# Patient Record
Sex: Female | Born: 1945 | Race: Black or African American | Hispanic: No | Marital: Single | Smoking: Never smoker
Health system: Southern US, Community
[De-identification: ages and names within clinical notes are randomized; demographics above are authoritative.]

## PROBLEM LIST (undated history)

## (undated) DIAGNOSIS — M4316 Spondylolisthesis, lumbar region: Secondary | ICD-10-CM

## (undated) DIAGNOSIS — D649 Anemia, unspecified: Secondary | ICD-10-CM

## (undated) DIAGNOSIS — E119 Type 2 diabetes mellitus without complications: Secondary | ICD-10-CM

## (undated) DIAGNOSIS — R51 Headache: Secondary | ICD-10-CM

## (undated) DIAGNOSIS — M1990 Unspecified osteoarthritis, unspecified site: Secondary | ICD-10-CM

## (undated) DIAGNOSIS — R519 Headache, unspecified: Secondary | ICD-10-CM

## (undated) DIAGNOSIS — Z9889 Other specified postprocedural states: Secondary | ICD-10-CM

## (undated) DIAGNOSIS — R112 Nausea with vomiting, unspecified: Secondary | ICD-10-CM

## (undated) DIAGNOSIS — I10 Essential (primary) hypertension: Secondary | ICD-10-CM

## (undated) HISTORY — PX: ABDOMINAL HYSTERECTOMY: SHX81

## (undated) HISTORY — PX: DILATION AND CURETTAGE OF UTERUS: SHX78

## (undated) HISTORY — DX: Spondylolisthesis, lumbar region: M43.16

## (undated) HISTORY — PX: OTHER SURGICAL HISTORY: SHX169

## (undated) HISTORY — PX: COLONOSCOPY: SHX174

---

## 1999-06-28 ENCOUNTER — Encounter: Payer: Self-pay | Admitting: Emergency Medicine

## 1999-06-28 ENCOUNTER — Emergency Department (HOSPITAL_COMMUNITY): Admission: EM | Admit: 1999-06-28 | Discharge: 1999-06-28 | Payer: Self-pay | Admitting: Emergency Medicine

## 1999-07-24 ENCOUNTER — Encounter: Payer: Self-pay | Admitting: *Deleted

## 1999-07-24 ENCOUNTER — Ambulatory Visit (HOSPITAL_COMMUNITY): Admission: RE | Admit: 1999-07-24 | Discharge: 1999-07-24 | Payer: Self-pay | Admitting: *Deleted

## 1999-10-02 ENCOUNTER — Ambulatory Visit (HOSPITAL_COMMUNITY): Admission: RE | Admit: 1999-10-02 | Discharge: 1999-10-02 | Payer: Self-pay | Admitting: Specialist

## 1999-10-02 ENCOUNTER — Encounter: Payer: Self-pay | Admitting: Specialist

## 1999-10-18 ENCOUNTER — Ambulatory Visit (HOSPITAL_COMMUNITY): Admission: RE | Admit: 1999-10-18 | Discharge: 1999-10-18 | Payer: Self-pay | Admitting: Specialist

## 1999-10-18 ENCOUNTER — Encounter: Payer: Self-pay | Admitting: Specialist

## 2002-04-14 ENCOUNTER — Other Ambulatory Visit: Admission: RE | Admit: 2002-04-14 | Discharge: 2002-04-14 | Payer: Self-pay | Admitting: Internal Medicine

## 2002-07-16 ENCOUNTER — Ambulatory Visit (HOSPITAL_COMMUNITY): Admission: RE | Admit: 2002-07-16 | Discharge: 2002-07-16 | Payer: Self-pay | Admitting: Gastroenterology

## 2004-07-30 ENCOUNTER — Encounter: Admission: RE | Admit: 2004-07-30 | Discharge: 2004-07-30 | Payer: Self-pay | Admitting: Internal Medicine

## 2004-08-14 ENCOUNTER — Encounter: Admission: RE | Admit: 2004-08-14 | Discharge: 2004-08-14 | Payer: Self-pay | Admitting: Internal Medicine

## 2014-06-16 ENCOUNTER — Encounter (HOSPITAL_COMMUNITY): Payer: Self-pay | Admitting: Emergency Medicine

## 2014-06-16 ENCOUNTER — Emergency Department (HOSPITAL_COMMUNITY): Payer: Medicare Other

## 2014-06-16 ENCOUNTER — Inpatient Hospital Stay (HOSPITAL_COMMUNITY)
Admission: EM | Admit: 2014-06-16 | Discharge: 2014-06-19 | DRG: 638 | Disposition: A | Discharge status: Home / Self Care | Payer: Medicare Other | Attending: Internal Medicine | Admitting: Internal Medicine

## 2014-06-16 DIAGNOSIS — I10 Essential (primary) hypertension: Secondary | ICD-10-CM

## 2014-06-16 DIAGNOSIS — E871 Hypo-osmolality and hyponatremia: Secondary | ICD-10-CM | POA: Diagnosis present

## 2014-06-16 DIAGNOSIS — N179 Acute kidney failure, unspecified: Secondary | ICD-10-CM | POA: Diagnosis present

## 2014-06-16 DIAGNOSIS — D72829 Elevated white blood cell count, unspecified: Secondary | ICD-10-CM

## 2014-06-16 DIAGNOSIS — D638 Anemia in other chronic diseases classified elsewhere: Secondary | ICD-10-CM

## 2014-06-16 DIAGNOSIS — E1110 Type 2 diabetes mellitus with ketoacidosis without coma: Secondary | ICD-10-CM

## 2014-06-16 DIAGNOSIS — E0810 Diabetes mellitus due to underlying condition with ketoacidosis without coma: Secondary | ICD-10-CM

## 2014-06-16 DIAGNOSIS — E875 Hyperkalemia: Secondary | ICD-10-CM | POA: Diagnosis present

## 2014-06-16 DIAGNOSIS — E1310 Other specified diabetes mellitus with ketoacidosis without coma: Principal | ICD-10-CM | POA: Diagnosis present

## 2014-06-16 DIAGNOSIS — Z794 Long term (current) use of insulin: Secondary | ICD-10-CM

## 2014-06-16 DIAGNOSIS — D649 Anemia, unspecified: Secondary | ICD-10-CM | POA: Diagnosis present

## 2014-06-16 HISTORY — DX: Type 2 diabetes mellitus without complications: E11.9

## 2014-06-16 HISTORY — DX: Essential (primary) hypertension: I10

## 2014-06-16 LAB — COMPREHENSIVE METABOLIC PANEL
ALBUMIN: 3.5 g/dL (ref 3.5–5.2)
ALT: 27 U/L (ref 0–35)
ANION GAP: 26 — AB (ref 5–15)
AST: 18 U/L (ref 0–37)
Alkaline Phosphatase: 165 U/L — ABNORMAL HIGH (ref 39–117)
BILIRUBIN TOTAL: 0.4 mg/dL (ref 0.3–1.2)
BUN: 47 mg/dL — ABNORMAL HIGH (ref 6–23)
CALCIUM: 9.5 mg/dL (ref 8.4–10.5)
CHLORIDE: 89 meq/L — AB (ref 96–112)
CO2: 17 meq/L — AB (ref 19–32)
CREATININE: 1.45 mg/dL — AB (ref 0.50–1.10)
GFR, EST AFRICAN AMERICAN: 41 mL/min — AB (ref >90)
GFR, EST NON AFRICAN AMERICAN: 36 mL/min — AB (ref >90)
Glucose, Bld: 897 mg/dL (ref 70–99)
Potassium: 5.9 mEq/L — ABNORMAL HIGH (ref 3.7–5.3)
SODIUM: 132 meq/L — AB (ref 137–147)
Total Protein: 7 g/dL (ref 6.0–8.3)

## 2014-06-16 LAB — CBC
HCT: 36.7 % (ref 36.0–46.0)
Hemoglobin: 11.5 g/dL — ABNORMAL LOW (ref 12.0–15.0)
MCH: 29.3 pg (ref 26.0–34.0)
MCHC: 31.3 g/dL (ref 30.0–36.0)
MCV: 93.6 fL (ref 78.0–100.0)
Platelets: 268 10*3/uL (ref 150–400)
RBC: 3.92 MIL/uL (ref 3.87–5.11)
RDW: 13.5 % (ref 11.5–15.5)
WBC: 11.2 10*3/uL — AB (ref 4.0–10.5)

## 2014-06-16 LAB — URINE MICROSCOPIC-ADD ON

## 2014-06-16 LAB — URINALYSIS, ROUTINE W REFLEX MICROSCOPIC
BILIRUBIN URINE: NEGATIVE
Glucose, UA: >1000 mg/dL — AB
Hgb urine dipstick: NEGATIVE
KETONES UR: 15 mg/dL — AB
LEUKOCYTES UA: NEGATIVE
Nitrite: NEGATIVE
PH: 5 (ref 5.0–8.0)
PROTEIN: NEGATIVE mg/dL
Specific Gravity, Urine: 1.03 (ref 1.005–1.030)
UROBILINOGEN UA: 0.2 mg/dL (ref 0.0–1.0)

## 2014-06-16 LAB — CBG MONITORING, ED: Glucose-Capillary: >600 mg/dL (ref 70–99)

## 2014-06-16 LAB — I-STAT TROPONIN, ED: Troponin i, poc: 0.01 ng/mL (ref 0.00–0.08)

## 2014-06-16 LAB — I-STAT CG4 LACTIC ACID, ED: Lactic Acid, Venous: 3.06 mmol/L — ABNORMAL HIGH (ref 0.5–2.2)

## 2014-06-16 LAB — MRSA PCR SCREENING: MRSA BY PCR: NEGATIVE

## 2014-06-16 MED ORDER — ONDANSETRON HCL 4 MG PO TABS: 4.0000 mg | ORAL_TABLET | Freq: Four times a day (QID) | ORAL | Status: DC | PRN

## 2014-06-16 MED ORDER — DEXTROSE-NACL 5-0.45 % IV SOLN
INTRAVENOUS | Status: DC
  Administered 2014-06-17: 03:00:00 via INTRAVENOUS

## 2014-06-16 MED ORDER — HEPARIN SODIUM (PORCINE) 5000 UNIT/ML IJ SOLN
5000.0000 [IU] | Freq: Three times a day (TID) | INTRAMUSCULAR | Status: DC
  Administered 2014-06-16 – 2014-06-19 (×8): 5000 [IU] via SUBCUTANEOUS
  Filled 2014-06-16 (×8): qty 1

## 2014-06-16 MED ORDER — DOCUSATE SODIUM 100 MG PO CAPS
100.0000 mg | ORAL_CAPSULE | Freq: Two times a day (BID) | ORAL | Status: DC
  Administered 2014-06-16 – 2014-06-18 (×5): 100 mg via ORAL
  Filled 2014-06-16 (×5): qty 1

## 2014-06-16 MED ORDER — HYDRALAZINE HCL 10 MG PO TABS
10.0000 mg | ORAL_TABLET | Freq: Four times a day (QID) | ORAL | Status: DC | PRN
  Administered 2014-06-19: 10 mg via ORAL
  Filled 2014-06-16 (×2): qty 1

## 2014-06-16 MED ORDER — INSULIN REGULAR HUMAN 100 UNIT/ML IJ SOLN
INTRAMUSCULAR | Status: DC
  Administered 2014-06-16: 10.8 [IU]/h via INTRAVENOUS
  Administered 2014-06-16: 5.4 [IU]/h via INTRAVENOUS
  Filled 2014-06-16: qty 1

## 2014-06-16 MED ORDER — SODIUM CHLORIDE 0.9 % IV SOLN: INTRAVENOUS | Status: DC

## 2014-06-16 MED ORDER — ADULT MULTIVITAMIN W/MINERALS CH
1.0000 | ORAL_TABLET | Freq: Every day | ORAL | Status: DC
  Administered 2014-06-17 – 2014-06-19 (×3): 1 via ORAL
  Filled 2014-06-16 (×4): qty 1

## 2014-06-16 MED ORDER — FOLIC ACID 1 MG PO TABS
1.0000 mg | ORAL_TABLET | Freq: Every day | ORAL | Status: DC
  Administered 2014-06-17 – 2014-06-19 (×3): 1 mg via ORAL
  Filled 2014-06-16 (×4): qty 1

## 2014-06-16 MED ORDER — DEXTROSE 50 % IV SOLN: 25.0000 mL | INTRAVENOUS | Status: DC | PRN

## 2014-06-16 MED ORDER — DEXTROSE-NACL 5-0.45 % IV SOLN: INTRAVENOUS | Status: DC

## 2014-06-16 MED ORDER — SODIUM CHLORIDE 0.9 % IV SOLN
1000.0000 mL | Freq: Once | INTRAVENOUS | Status: AC
  Administered 2014-06-16: 1000 mL via INTRAVENOUS

## 2014-06-16 MED ORDER — ONDANSETRON HCL 4 MG/2ML IJ SOLN: 4.0000 mg | Freq: Four times a day (QID) | INTRAMUSCULAR | Status: DC | PRN

## 2014-06-16 MED ORDER — SODIUM CHLORIDE 0.9 % IV SOLN
1000.0000 mL | INTRAVENOUS | Status: DC
Start: 2014-06-16 — End: 2014-06-17
  Administered 2014-06-16: 1000 mL via INTRAVENOUS

## 2014-06-16 MED ORDER — VITAMIN B-1 100 MG PO TABS
100.0000 mg | ORAL_TABLET | Freq: Every day | ORAL | Status: DC
  Administered 2014-06-17 – 2014-06-19 (×3): 100 mg via ORAL
  Filled 2014-06-16 (×4): qty 1

## 2014-06-16 MED ORDER — BIOTENE DRY MOUTH MT LIQD
15.0000 mL | Freq: Two times a day (BID) | OROMUCOSAL | Status: DC
Start: 2014-06-16 — End: 2014-06-19
  Administered 2014-06-16 – 2014-06-19 (×5): 15 mL via OROMUCOSAL

## 2014-06-16 MED ORDER — LORATADINE 10 MG PO TABS
10.0000 mg | ORAL_TABLET | Freq: Every day | ORAL | Status: DC | PRN
  Administered 2014-06-17 – 2014-06-18 (×4): 10 mg via ORAL
  Filled 2014-06-16 (×4): qty 1

## 2014-06-16 MED ORDER — SODIUM CHLORIDE 0.9 % IJ SOLN
3.0000 mL | Freq: Two times a day (BID) | INTRAMUSCULAR | Status: DC
  Administered 2014-06-17 – 2014-06-19 (×5): 3 mL via INTRAVENOUS

## 2014-06-16 MED ORDER — INSULIN REGULAR HUMAN 100 UNIT/ML IJ SOLN
INTRAMUSCULAR | Status: DC
  Administered 2014-06-17: 2.4 [IU]/h via INTRAVENOUS
  Filled 2014-06-16 (×2): qty 1

## 2014-06-16 MED ORDER — INSULIN REGULAR BOLUS VIA INFUSION
0.0000 [IU] | Freq: Three times a day (TID) | INTRAVENOUS | Status: DC
  Filled 2014-06-16: qty 10

## 2014-06-16 MED ORDER — ASPIRIN EC 81 MG PO TBEC
81.0000 mg | DELAYED_RELEASE_TABLET | Freq: Every day | ORAL | Status: DC
Start: 2014-06-17 — End: 2014-06-19
  Administered 2014-06-17 – 2014-06-19 (×3): 81 mg via ORAL
  Filled 2014-06-16 (×4): qty 1

## 2014-06-16 NOTE — H&P (Signed)
[facility] History and Physical  Cheryl Carey ZOX:[id number] DOB: 04-30-1946 DOA: 06/16/2014  Referring physician: Marena ChancyWilliam Walden, MD PCP: No primary provider on file.   Chief Complaint: DKA  HPI: Cheryl Carey is a 68 y.o. female with prior history of diabetes on insulin presents with severe hyperglycemia. Patient states that she had recently been in [place]. She states that she had noted she was having some sinus congestion but did not really think too much about it. Patient states that she has been a little more tired and she has noted some lightheadedness. She did not lose consciousness. She states that now she seems to be feeling better. On evaluation she was not making eye contact. She states that she has had no urine difficulty and she has no respiratory symptoms.   Review of Systems:  Constitutional:  No weight loss, night sweats, Fevers, chills, ++fatigue.  HEENT:  ++headaches,no itching, ear ache, nasal congestion, post nasal drip,  Cardio-vascular:  No chest pain, Orthopnea, PND, swelling in lower extremities  GI:  No heartburn, indigestion, abdominal pain, nausea, vomiting, diarrhea  Resp:  No shortness of breath with exertion or at rest. No excess mucus, no productive cough, No non-productive cough Skin:  no rash or lesions.  GU:  no dysuria, change in color of urine, no urgency or frequency Musculoskeletal:  No joint pain or swelling. No decreased range of motion  Psych:  No change in mood or affect. No depression or anxiety.   Past Medical History  Diagnosis Date  . Hypertension   . Diabetes mellitus without complication    Past Surgical History  Procedure Laterality Date  . Abdominal hysterectomy     Social History:  reports that she has never smoked. She does not have any smokeless tobacco history on file. She reports that she does not drink alcohol or use illicit drugs.  Allergies  Allergen Reactions  . Codeine Nausea Only    No family  history on file.   Prior to Admission medications   Medication Sig Start Date End Date Taking? Authorizing Provider  insulin detemir (LEVEMIR) 100 UNIT/ML injection Inject 3-19 Units into the skin daily. 19 units in the morning and 3 in the evening   Yes Historical Provider, MD  insulin lispro (HUMALOG) 100 UNIT/ML injection Inject 3-6 Units into the skin 3 (three) times daily before meals.   Yes Historical Provider, MD   Physical Exam: Filed Vitals:   06/16/14 1920  BP: 161/61  Pulse: 93  Temp:   Resp: 21    BP 161/61  Pulse 93  Temp(Src) 98.9 F (37.2 C) (Oral)  Resp 21  Ht 5\' 2"  (1.575 m)  SpO2 100%  General:  Appears calm and comfortable Eyes: PERRL, normal lids, irises & conjunctiva ENT: grossly normal hearing, lips & tongue Neck: no LAD, masses or thyromegaly Cardiovascular: RRR, no m/r/g. No LE edema. Respiratory: CTA bilaterally, no w/r/r. Normal respiratory effort. Abdomen: soft, ntnd Skin: no rash or induration seen on limited exam Musculoskeletal: grossly normal tone BUE/BLE Psychiatric: somewhat confused, speech fluent Neurologic: grossly non-focal.          Labs on Admission:  Basic Metabolic Panel:  Recent Labs Lab 06/16/14 1850  NA 132*  K 5.9*  CL 89*  CO2 17*  GLUCOSE 897*  BUN 47*  CREATININE 1.45*  CALCIUM 9.5   Liver Function Tests:  Recent Labs Lab 06/16/14 1850  AST 18  ALT 27  ALKPHOS 165*  BILITOT 0.4  PROT 7.0  ALBUMIN 3.5   No results found for this basename: LIPASE, AMYLASE,  in the last 168 hours No results found for this basename: AMMONIA,  in the last 168 hours CBC:  Recent Labs Lab 06/16/14 1850  WBC 11.2*  HGB 11.5*  HCT 36.7  MCV 93.6  PLT 268   Cardiac Enzymes: No results found for this basename: CKTOTAL, CKMB, CKMBINDEX, TROPONINI,  in the last 168 hours  BNP (last 3 results) No results found for this basename: PROBNP,  in the last 8760 hours CBG:  Recent Labs Lab 06/16/14 1825 06/16/14 2019    GLUCAP >600* >600*    Radiological Exams on Admission: Dg Chest 2 View  06/16/2014   CLINICAL DATA:  Hyperglycemia.  Weakness.  EXAM: CHEST  2 VIEW  COMPARISON:  None.  FINDINGS: The heart size and mediastinal contours are within normal limits. Both lungs are clear. There is a surgical screw which crosses the right scapula and the adjacent to the glenoid. Bony thorax is otherwise unremarkable.  IMPRESSION: No active cardiopulmonary disease.   Electronically Signed   By: Amie Portland M.D.   On: 06/16/2014 20:07     Assessment/Plan Principal Problem:   DKA (diabetic ketoacidoses) Active Problems:   Hypertension   1. DKA -patient will be admitted to stepdown -will start on insulin drip -hydrate with IV NS x2 liter bolus than run until glucose is below 250 -will check A1C  2. Hypertension -will continue with home medications  3. Confusion -not clear she is a little confused but not sureif this is baseline or related to her high glucose -would scan her head presently -in addition she did initially complain of having sinus congestion and pain would start on levaquin emperrically  4. Electrolyte disturbances -related to her hyperglycemia -will hydrate and start on insulin    Code Status: Full Code (must indicate code status--if unknown or must be presumed, indicate so) Family Communication: None (indicate person spoken with, if applicable, with phone number if by telephone) Disposition Plan: Home (indicate anticipated LOS)  Time spent:  [facility] Pager [phone]  **Disclaimer: This note may have been dictated with voice recognition software. Similar sounding words can inadvertently be transcribed and this note may contain transcription errors which may not have been corrected upon publication of note.**

## 2014-06-16 NOTE — ED Notes (Signed)
Patient unable to void at this time

## 2014-06-16 NOTE — ED Provider Notes (Signed)
CSN: [id number]     Arrival date & time 06/16/14  1813 History   First MD Initiated Contact with Patient 06/16/14 1822     Chief Complaint  Patient presents with  . Hyperglycemia     (Consider location/radiation/quality/duration/timing/severity/associated sxs/prior Treatment) Patient is a 68 y.o. female presenting with dizziness. The history is provided by the patient.  Dizziness Quality:  Lightheadedness Severity:  Moderate Onset quality:  Sudden Timing:  Intermittent Progression:  Resolved Chronicity:  New Context comment:  Spontaneously Relieved by:  Nothing Worsened by:  Nothing tried Associated symptoms: no shortness of breath and no vomiting     Past Medical History  Diagnosis Date  . Hypertension   . Diabetes mellitus without complication    Past Surgical History  Procedure Laterality Date  . Abdominal hysterectomy     No family history on file. History  Substance Use Topics  . Smoking status: Never Smoker   . Smokeless tobacco: Not on file  . Alcohol Use: No   OB History   Grav Para Term Preterm Abortions TAB SAB Ect Mult Living                 Review of Systems  Constitutional: Negative for fever.  Respiratory: Negative for cough and shortness of breath.   Gastrointestinal: Negative for vomiting and abdominal pain.  Neurological: Positive for dizziness.  All other systems reviewed and are negative.     Allergies  Review of patient's allergies indicates not on file.  Home Medications   Prior to Admission medications   Not on File   BP 161/61  Pulse 93  Temp(Src) 98.9 F (37.2 C) (Oral)  Resp 21  SpO2 100% Physical Exam  Nursing note and vitals reviewed. Constitutional: She is oriented to person, place, and time. She appears well-developed and well-nourished. No distress.  HENT:  Head: Normocephalic and atraumatic.  Mouth/Throat: Oropharynx is clear and moist.  Eyes: EOM are normal. Pupils are equal, round, and reactive to light.   Neck: Normal range of motion. Neck supple.  Cardiovascular: Normal rate and regular rhythm.  Exam reveals no friction rub.   No murmur heard. Pulmonary/Chest: Effort normal and breath sounds normal. No respiratory distress. She has no wheezes. She has no rales.  Abdominal: Soft. She exhibits no distension. There is no tenderness. There is no rebound.  Musculoskeletal: Normal range of motion. She exhibits no edema.  Neurological: She is alert and oriented to person, place, and time.  Skin: No rash noted. She is not diaphoretic.    ED Course  Procedures (including critical care time) Labs Review Labs Reviewed  CBC - Abnormal; Notable for the following:    WBC 11.2 (*)    Hemoglobin 11.5 (*)    All other components within normal limits  CBG MONITORING, ED - Abnormal; Notable for the following:    Glucose-Capillary >600 (*)    All other components within normal limits  COMPREHENSIVE METABOLIC PANEL  URINALYSIS, ROUTINE W REFLEX MICROSCOPIC  I-STAT TROPOININ, ED  I-STAT CG4 LACTIC ACID, ED    Imaging Review No results found.   EKG Interpretation None     CRITICAL CARE Performed by: Dagmar Hait   Total critical care time: 30 minutes   Critical care time was exclusive of separately billable procedures and treating other patients.  Critical care was necessary to treat or prevent imminent or life-threatening deterioration.  Critical care was time spent personally by me on the following activities: development of treatment plan with patient  and/or surrogate as well as nursing, discussions with consultants, evaluation of patient's response to treatment, examination of patient, obtaining history from patient or surrogate, ordering and performing treatments and interventions, ordering and review of laboratory studies, ordering and review of radiographic studies, pulse oximetry and re-evaluation of patient's condition.  MDM   Final diagnoses:  Diabetic ketoacidosis  without coma associated with type 2 diabetes mellitus  Essential hypertension    63F here with hyperglycemia. Recent flare up of allergies while traveling to [place], returned from there 2 days ago. States she's feeling better today but not 100%. Pulled over while driving because she was feeling lightheaded and dizzy - no CP, no SOB. Mild associated nausea. Took her insulin this morning as instructed, only missed midday short-acting. Here AFVSS. Slowly responding, but acting answering appropriately.  Labs show anion gap of 26, glucose of 890s. EKG and troponin ok, no PNA or UTI. Glucosestabilizer initiated, admitted.     Dagmar HaitWilliam Arron Mcnaught, MD 06/17/14 [phone]

## 2014-06-16 NOTE — ED Notes (Signed)
Bed: WA08 Expected date:  Expected time:  Means of arrival:  Comments: EMS- leg pain

## 2014-06-16 NOTE — ED Notes (Signed)
Per EMS pt hyperglycemic. Pt pulled on side of road because she started feeling bad. Pt thought she had low sugar so started eating. EMS read above 500 with CBG. Pt able to follow commands and answer questions but slow to respond questions.

## 2014-06-16 NOTE — Progress Notes (Signed)
  CARE MANAGEMENT ED NOTE 06/16/2014  Patient:  Cheryl Carey,Cheryl Carey   Account Number:  [id number]  Date Initiated:  06/16/2014  Documentation initiated by:  Radford PaxFERRERO,Artisha Capri  Subjective/Objective Assessment:   Patient presents to Ed with severe hyperglycemia.     Subjective/Objective Assessment Detail:   Blood sugar >600     Action/Plan:   Action/Plan Detail:   Anticipated DC Date:       Status Recommendation to Physician:   Result of Recommendation:    Other ED Services  Consult Working Plan    DC Planning Services  Other  PCP issues    Choice offered to / List presented to:            Status of service:  Completed, signed off  ED Comments:   ED Comments Detail:  EDCM spoke to patient at bedside.  Patient cannot remember the name of her pcp, but her Endocronologist is Dr. Horald PollenBalen.

## 2014-06-17 ENCOUNTER — Encounter (HOSPITAL_COMMUNITY): Payer: Self-pay

## 2014-06-17 DIAGNOSIS — D638 Anemia in other chronic diseases classified elsewhere: Secondary | ICD-10-CM

## 2014-06-17 DIAGNOSIS — E1310 Other specified diabetes mellitus with ketoacidosis without coma: Principal | ICD-10-CM

## 2014-06-17 DIAGNOSIS — D72829 Elevated white blood cell count, unspecified: Secondary | ICD-10-CM

## 2014-06-17 DIAGNOSIS — I10 Essential (primary) hypertension: Secondary | ICD-10-CM

## 2014-06-17 LAB — TSH: TSH: 0.424 u[IU]/mL (ref 0.350–4.500)

## 2014-06-17 LAB — GLUCOSE, CAPILLARY
GLUCOSE-CAPILLARY: 566 mg/dL — AB (ref 70–99)
GLUCOSE-CAPILLARY: 206 mg/dL — AB (ref 70–99)
GLUCOSE-CAPILLARY: 106 mg/dL — AB (ref 70–99)
GLUCOSE-CAPILLARY: 84 mg/dL (ref 70–99)
GLUCOSE-CAPILLARY: 244 mg/dL — AB (ref 70–99)
GLUCOSE-CAPILLARY: 346 mg/dL — AB (ref 70–99)
Glucose-Capillary: 120 mg/dL — ABNORMAL HIGH (ref 70–99)
Glucose-Capillary: 158 mg/dL — ABNORMAL HIGH (ref 70–99)
Glucose-Capillary: 201 mg/dL — ABNORMAL HIGH (ref 70–99)
Glucose-Capillary: 292 mg/dL — ABNORMAL HIGH (ref 70–99)
Glucose-Capillary: 292 mg/dL — ABNORMAL HIGH (ref 70–99)
Glucose-Capillary: 370 mg/dL — ABNORMAL HIGH (ref 70–99)
Glucose-Capillary: 492 mg/dL — ABNORMAL HIGH (ref 70–99)
Glucose-Capillary: 89 mg/dL (ref 70–99)
Glucose-Capillary: 99 mg/dL (ref 70–99)

## 2014-06-17 LAB — COMPREHENSIVE METABOLIC PANEL
ALK PHOS: 108 U/L (ref 39–117)
ALT: 25 U/L (ref 0–35)
ANION GAP: 13 (ref 5–15)
AST: 17 U/L (ref 0–37)
Albumin: 3.1 g/dL — ABNORMAL LOW (ref 3.5–5.2)
BILIRUBIN TOTAL: 0.3 mg/dL (ref 0.3–1.2)
BUN: 39 mg/dL — ABNORMAL HIGH (ref 6–23)
CHLORIDE: 102 meq/L (ref 96–112)
CO2: 24 mEq/L (ref 19–32)
Calcium: 9 mg/dL (ref 8.4–10.5)
Creatinine, Ser: 1.3 mg/dL — ABNORMAL HIGH (ref 0.50–1.10)
GFR calc non Af Amer: 41 mL/min — ABNORMAL LOW (ref >90)
GFR, EST AFRICAN AMERICAN: 47 mL/min — AB (ref >90)
GLUCOSE: 213 mg/dL — AB (ref 70–99)
POTASSIUM: 4.1 meq/L (ref 3.7–5.3)
Sodium: 139 mEq/L (ref 137–147)
Total Protein: 6.3 g/dL (ref 6.0–8.3)

## 2014-06-17 LAB — CBC
HCT: 31.8 % — ABNORMAL LOW (ref 36.0–46.0)
Hemoglobin: 10.6 g/dL — ABNORMAL LOW (ref 12.0–15.0)
MCH: 29.6 pg (ref 26.0–34.0)
MCHC: 33.3 g/dL (ref 30.0–36.0)
MCV: 88.8 fL (ref 78.0–100.0)
Platelets: 255 10*3/uL (ref 150–400)
RBC: 3.58 MIL/uL — ABNORMAL LOW (ref 3.87–5.11)
RDW: 13 % (ref 11.5–15.5)
WBC: 12.5 10*3/uL — ABNORMAL HIGH (ref 4.0–10.5)

## 2014-06-17 LAB — HEMOGLOBIN A1C
Hgb A1c MFr Bld: 9.7 % — ABNORMAL HIGH (ref <5.7)
Mean Plasma Glucose: 232 mg/dL — ABNORMAL HIGH (ref <117)

## 2014-06-17 MED ORDER — INSULIN GLARGINE 100 UNIT/ML ~~LOC~~ SOLN
5.0000 [IU] | Freq: Once | SUBCUTANEOUS | Status: AC
  Administered 2014-06-17: 5 [IU] via SUBCUTANEOUS
  Filled 2014-06-17: qty 0.05

## 2014-06-17 MED ORDER — ALUM & MAG HYDROXIDE-SIMETH 200-200-20 MG/5ML PO SUSP
ORAL | Status: AC
  Filled 2014-06-17: qty 30

## 2014-06-17 MED ORDER — INSULIN ASPART 100 UNIT/ML ~~LOC~~ SOLN
0.0000 [IU] | Freq: Three times a day (TID) | SUBCUTANEOUS | Status: DC
  Administered 2014-06-17: 5 [IU] via SUBCUTANEOUS
  Administered 2014-06-17: 11 [IU] via SUBCUTANEOUS
  Administered 2014-06-18: 15 [IU] via SUBCUTANEOUS
  Administered 2014-06-18: 5 [IU] via SUBCUTANEOUS
  Administered 2014-06-19: 2 [IU] via SUBCUTANEOUS

## 2014-06-17 MED ORDER — ALUM & MAG HYDROXIDE-SIMETH 200-200-20 MG/5ML PO SUSP
30.0000 mL | Freq: Once | ORAL | Status: AC
  Administered 2014-06-17: 30 mL via ORAL

## 2014-06-17 MED ORDER — INSULIN ASPART 100 UNIT/ML ~~LOC~~ SOLN
0.0000 [IU] | Freq: Every day | SUBCUTANEOUS | Status: DC
  Administered 2014-06-17: 3 [IU] via SUBCUTANEOUS

## 2014-06-17 MED ORDER — AMLODIPINE BESYLATE 5 MG PO TABS
2.5000 mg | ORAL_TABLET | Freq: Every day | ORAL | Status: DC
  Administered 2014-06-17 – 2014-06-18 (×2): 2.5 mg via ORAL
  Filled 2014-06-17 (×2): qty 1

## 2014-06-17 NOTE — Progress Notes (Signed)
Patient ID: Cheryl Carey, female   DOB: August 14, 1946, 68 y.o.   MRN: [id number] [facility] PROGRESS NOTE  Cheryl Carey [id number]:[id number] DOB: 05/23/1946 DOA: 06/16/2014 PCP: No primary provider on file.  Brief narrative: 68 y.o. female with prior history of diabetes on insulin who presented to [facility] ED 06/16/2014 lightheadedness, near-syncope. Patient reports being compliant with insulin regimen and never experienced similar type of event. On admission, blood glucose was 897, she was found to be in DKA. She required admission to step down unit and insulin drip.  Assessment/Plan:  Principal Problem:   DKA (diabetic ketoacidoses)  Required insulin drip on admission. She's still on insulin drip this morning. Her anion gap is closed and CO2 is within normal limits. We will transition to subcutaneous insulin. She will be given Lantus 5 units subcutaneously, we will then stop insulin drip in about one to 2 hours. Patient can then have diabetic diet.  Glycemic order set in place.  Check A1c  Diabetic coordinator consulted, appreciate their recommendations Active Problems:   Hypertension  Her blood pressure was low on admission 98/78. This morning blood pressure is 140/80. We will start Norvasc 2.5 mg daily for better blood pressure control.   Acute renal failure  No previous blood work for comparison. Her creatinine was 1.45 on the admission and is trending down with IV fluids to 1.30. This is likely pre-renal etiology, dehydration and DKA seems to be contributing factors.   Anemia, normocytic  She is on folate supplement. She could also have anemia of chronic disease related to renal insufficiency. We don't have previous values for comparison.  Her hemoglobin is stable with no indications for transfusion.   Hyperkalemia  Likely secondary to diabetic ketoacidosis. Has resolved with insulin.   Hyponatremia  Likely due to dehydration. Has resolved with IV fluids.   DVT prophylaxis:  Heparin subcutaneous while patient is in hospital  Code Status: full code  Family Communication: plan of care discussed with the patient Disposition Plan: Remains in step down unit, still is on insulin drip  Manson Passey, MD  [facility] Pager [phone]  If 7PM-7AM, please contact night-coverage [email or web address] Password [email or web address] 06/17/2014, 10:17 AM   LOS: 1 day   Consultants:  None   Procedures:  None   Antibiotics:  None   HPI/Subjective: No acute overnight events.  Objective: Filed Vitals:   06/17/14 0500 06/17/14 0600 06/17/14 0736 06/17/14 0800  BP:  145/52 140/80   Pulse: 77 73 73   Temp:    98.4 F (36.9 C)  TempSrc:    Oral  Resp: 18 21 20    Height:      Weight:      SpO2: 95% 100% 98%     Intake/Output Summary (Last 24 hours) at 06/17/14 1017 Last data filed at 06/17/14 0800  Gross per 24 hour  Intake 1016.06 ml  Output   1600 ml  Net -583.94 ml    Exam:   General:  Pt is alert, follows commands appropriately, not in acute distress  Cardiovascular: Regular rate and rhythm, S1/S2, no murmurs  Respiratory: Clear to auscultation bilaterally, no wheezing, no crackles, no rhonchi  Abdomen: Soft, non tender, non distended, bowel sounds present  Extremities: No edema, pulses DP and PT palpable bilaterally  Neuro: Grossly nonfocal  Data Reviewed: Basic Metabolic Panel:  Recent Labs Lab 06/16/14 1850 06/17/14 0340  NA 132* 139  K 5.9* 4.1  CL 89* 102  CO2 17* 24  GLUCOSE 897* 213*  BUN  47* 39*  CREATININE 1.45* 1.30*  CALCIUM 9.5 9.0   Liver Function Tests:  Recent Labs Lab 06/16/14 1850 06/17/14 0340  AST 18 17  ALT 27 25  ALKPHOS 165* 108  BILITOT 0.4 0.3  PROT 7.0 6.3  ALBUMIN 3.5 3.1*   No results found for this basename: LIPASE, AMYLASE,  in the last 168 hours No results found for this basename: AMMONIA,  in the last 168 hours CBC:  Recent Labs Lab 06/16/14 1850 06/17/14 0340  WBC 11.2* 12.5*  HGB 11.5*  10.6*  HCT 36.7 31.8*  MCV 93.6 88.8  PLT 268 255   Cardiac Enzymes: No results found for this basename: CKTOTAL, CKMB, CKMBINDEX, TROPONINI,  in the last 168 hours BNP: No components found with this basename: POCBNP,  CBG:  Recent Labs Lab 06/17/14 0620 06/17/14 0724 06/17/14 0814 06/17/14 0909 06/17/14 0924  GLUCAP 120* 106* 84 99 89    Recent Results (from the past 240 hour(s))  MRSA PCR SCREENING     Status: None   Collection Time    06/16/14 10:25 PM      Result Value Ref Range Status   MRSA by PCR NEGATIVE  NEGATIVE Final     Studies: Dg Chest 2 View 06/16/2014    IMPRESSION: No active cardiopulmonary disease.   Electronically Signed   By: Amie Portlandavid  Ormond M.D.   On: 06/16/2014 20:07    Scheduled Meds: . aspirin EC  81 mg Oral Daily  . docusate sodium  100 mg Oral BID  . folic acid  1 mg Oral Daily  . heparin  5,000 Units Subcutaneous 3 times per day  . insulin aspart  0-15 Units Subcutaneous TID WC  . insulin aspart  0-5 Units Subcutaneous QHS  . insulin regular  0-10 Units Intravenous TID WC  . multivitamin   1 tablet Oral Daily  . thiamine  100 mg Oral Daily

## 2014-06-17 NOTE — Progress Notes (Signed)
Inpatient Diabetes Program Recommendations  AACE/ADA: New Consensus Statement on Inpatient Glycemic Control (2013)  Target Ranges:  Prepandial:   less than 140 mg/dL      Peak postprandial:   less than 180 mg/dL (1-2 hours)      Critically ill patients:  140 - 180 mg/dL   Inpatient Diabetes Program Recommendations Insulin - Basal: change to Levemir 15 units  HgbA1C: =9.7  Note: This coordinator spoke with patient concerning admit with DKA.  Pt reports that she takes her insulin as prescribed but admits to dietary indiscretion during vacation recently.  She reports that she did take her insulin while on vacation.  Pt became disoriented and lightheaded while driving so she pulled off road and had a snack.  She assumed that her glucose was low but she did not have a meter with her.  She does not remember events of the day leading up to this event.  She does not remember if she checked her CBG or took her insulin.  She does not even remember where she was going or where she was found.  She assumes that she did check her glucose that day and took her insulin because it is routine for her.  She has an appointment next week with her endocrinologist Dr. Talmage NapBalan.  She now has a plan to check her CBG just prior to driving a car and also to take a glucose meter with her at all times.  No further questions/concerns at the end of our conversation. Thank you  Piedad ClimesGina Aleasha Fregeau BSN, RN,CDE Inpatient Diabetes Coordinator [phone] (team pager)

## 2014-06-18 LAB — GLUCOSE, CAPILLARY
GLUCOSE-CAPILLARY: 397 mg/dL — AB (ref 70–99)
GLUCOSE-CAPILLARY: 413 mg/dL — AB (ref 70–99)
Glucose-Capillary: 240 mg/dL — ABNORMAL HIGH (ref 70–99)
Glucose-Capillary: 372 mg/dL — ABNORMAL HIGH (ref 70–99)
Glucose-Capillary: 449 mg/dL — ABNORMAL HIGH (ref 70–99)
Glucose-Capillary: 117 mg/dL — ABNORMAL HIGH (ref 70–99)

## 2014-06-18 LAB — BASIC METABOLIC PANEL
Anion gap: 11 (ref 5–15)
BUN: 33 mg/dL — ABNORMAL HIGH (ref 6–23)
CALCIUM: 9.3 mg/dL (ref 8.4–10.5)
CO2: 25 mEq/L (ref 19–32)
Chloride: 99 mEq/L (ref 96–112)
Creatinine, Ser: 1.3 mg/dL — ABNORMAL HIGH (ref 0.50–1.10)
GFR calc non Af Amer: 41 mL/min — ABNORMAL LOW (ref >90)
GFR, EST AFRICAN AMERICAN: 47 mL/min — AB (ref >90)
Glucose, Bld: 423 mg/dL — ABNORMAL HIGH (ref 70–99)
POTASSIUM: 4.9 meq/L (ref 3.7–5.3)
SODIUM: 135 meq/L — AB (ref 137–147)

## 2014-06-18 MED ORDER — INSULIN DETEMIR 100 UNIT/ML ~~LOC~~ SOLN
21.0000 [IU] | Freq: Every day | SUBCUTANEOUS | Status: DC
  Administered 2014-06-18: 21 [IU] via SUBCUTANEOUS
  Filled 2014-06-18 (×2): qty 0.21

## 2014-06-18 MED ORDER — INSULIN ASPART 100 UNIT/ML ~~LOC~~ SOLN: 0.0000 [IU] | Freq: Three times a day (TID) | SUBCUTANEOUS | Status: DC

## 2014-06-18 MED ORDER — INSULIN ASPART 100 UNIT/ML ~~LOC~~ SOLN: 15.0000 [IU] | Freq: Once | SUBCUTANEOUS | Status: DC

## 2014-06-18 MED ORDER — ACETAMINOPHEN 325 MG PO TABS
650.0000 mg | ORAL_TABLET | Freq: Four times a day (QID) | ORAL | Status: DC | PRN
  Administered 2014-06-18: 650 mg via ORAL
  Filled 2014-06-18: qty 2

## 2014-06-18 MED ORDER — INSULIN ASPART 100 UNIT/ML ~~LOC~~ SOLN
6.0000 [IU] | Freq: Once | SUBCUTANEOUS | Status: AC
  Administered 2014-06-18: 6 [IU] via SUBCUTANEOUS

## 2014-06-18 MED ORDER — INSULIN ASPART 100 UNIT/ML ~~LOC~~ SOLN
6.0000 [IU] | Freq: Three times a day (TID) | SUBCUTANEOUS | Status: DC
Start: 2014-06-18 — End: 2014-06-19
  Administered 2014-06-18 – 2014-06-19 (×2): 6 [IU] via SUBCUTANEOUS

## 2014-06-18 MED ORDER — AMLODIPINE BESYLATE 2.5 MG PO TABS: 2.5000 mg | ORAL_TABLET | Freq: Every day | ORAL | Status: DC

## 2014-06-18 MED ORDER — INSULIN ASPART 100 UNIT/ML ~~LOC~~ SOLN: 10.0000 [IU] | Freq: Once | SUBCUTANEOUS | Status: DC

## 2014-06-18 MED ORDER — METFORMIN HCL 500 MG PO TABS
1000.0000 mg | ORAL_TABLET | Freq: Two times a day (BID) | ORAL | Status: DC
  Administered 2014-06-18 – 2014-06-19 (×3): 1000 mg via ORAL
  Filled 2014-06-18 (×5): qty 2

## 2014-06-18 NOTE — Progress Notes (Signed)
Patient ID: Cheryl Carey, female   DOB: August 11, 1946, 68 y.o.   MRN: [id number] [facility] PROGRESS NOTE  KAILANA BENNINGER [id number]:[id number] DOB: June 23, 1946 DOA: 06/16/2014 PCP: No primary provider on file.  Brief narrative: 68 y.o. female with prior history of diabetes on insulin who presented to [facility] ED 06/16/2014 lightheadedness, near-syncope. Patient reports being compliant with insulin regimen and never experienced similar type of event. On admission, blood glucose was 897, she was found to be in DKA. She required admission to step down unit and insulin drip.   Assessment/Plan:   Principal Problem:  DKA (diabetic ketoacidoses)  Required insulin drip on admission. AG closed but CBG's still in 300-400 range; will get BMP today and she may require re-starting insulin drip Restarted insulin home regimen; levemir 21 units daily and novolog 6 units TIDAC Continue sliding scale insulin  Diabetic coordinator consulted, appreciate their recommendations Active Problems:  Hypertension  Added Norvasc 2.5 mg daily for better blood pressure control. Acute renal failure  No previous blood work for comparison. Her creatinine was 1.45 on the admission and is trending down with IV fluids to 1.30. This is likely pre-renal etiology, dehydration and DKA all seems to be contributing factors. Anemia, normocytic  She is on folate supplement. She could also have anemia of chronic disease related to renal insufficiency. We don't have previous values for comparison.  Her hemoglobin is stable with no indications for transfusion. Hyperkalemia  Likely secondary to diabetic ketoacidosis. Has resolved with insulin. Hyponatremia  Likely due to dehydration. Has resolved with IV fluids.   DVT prophylaxis: Heparin subcutaneous while patient is in hospital    Code Status: full code  Family Communication: plan of care discussed with the patient  Disposition Plan: Remains in step down unit  Consultants:  None   Procedures:  None  Antibiotics:  None    Manson Passey, MD  [facility] Pager [phone]  If 7PM-7AM, please contact night-coverage [email or web address] Password [facility] 06/18/2014, 3:37 PM   LOS: 2 days   HPI/Subjective: No acute overnight events.  Objective: Filed Vitals:   06/18/14 0000 06/18/14 0400 06/18/14 0800 06/18/14 1200  BP: 149/54 132/56 163/68   Pulse:      Temp: 98.7 F (37.1 C) 98.2 F (36.8 C) 98.9 F (37.2 C) 99 F (37.2 C)  TempSrc: Oral Oral Oral Oral  Resp: 17 16 19    Height:      Weight:      SpO2: 89% 94% 98%     Intake/Output Summary (Last 24 hours) at 06/18/14 1537 Last data filed at 06/17/14 2125  Gross per 24 hour  Intake      3 ml  Output      0 ml  Net      3 ml    Exam:   General:  Pt is alert, follows commands appropriately, not in acute distress  Cardiovascular: Regular rate and rhythm, S1/S2, no murmurs  Respiratory: Clear to auscultation bilaterally, no wheezing, no crackles, no rhonchi  Abdomen: Soft, non tender, non distended, bowel sounds present  Extremities: No edema, pulses DP and PT palpable bilaterally  Neuro: Grossly nonfocal  Data Reviewed: Basic Metabolic Panel:  Recent Labs Lab 06/16/14 1850 06/17/14 0340  NA 132* 139  K 5.9* 4.1  CL 89* 102  CO2 17* 24  GLUCOSE 897* 213*  BUN 47* 39*  CREATININE 1.45* 1.30*  CALCIUM 9.5 9.0   Liver Function Tests:  Recent Labs Lab 06/16/14 1850 06/17/14 0340  AST 18 17  ALT 27 25  ALKPHOS 165* 108  BILITOT 0.4 0.3  PROT 7.0 6.3  ALBUMIN 3.5 3.1*   No results found for this basename: LIPASE, AMYLASE,  in the last 168 hours No results found for this basename: AMMONIA,  in the last 168 hours CBC:  Recent Labs Lab 06/16/14 1850 06/17/14 0340  WBC 11.2* 12.5*  HGB 11.5* 10.6*  HCT 36.7 31.8*  MCV 93.6 88.8  PLT 268 255   Cardiac Enzymes: No results found for this basename: CKTOTAL, CKMB, CKMBINDEX, TROPONINI,  in the last 168 hours BNP: No  components found with this basename: POCBNP,  CBG:  Recent Labs Lab 06/17/14 1705 06/17/14 2108 06/18/14 0209 06/18/14 0727 06/18/14 1152  GLUCAP 346* 292* 397* 240* 372*    Recent Results (from the past 240 hour(s))  MRSA PCR SCREENING     Status: None   Collection Time    06/16/14 10:25 PM      Result Value Ref Range Status   MRSA by PCR NEGATIVE  NEGATIVE Final   Comment:            The GeneXpert MRSA Assay (FDA     approved for NASAL specimens     only), is one component of a     comprehensive MRSA colonization     surveillance program. It is not     intended to diagnose MRSA     infection nor to guide or     monitor treatment for     MRSA infections.     Studies: Dg Chest 2 View  06/16/2014   CLINICAL DATA:  Hyperglycemia.  Weakness.  EXAM: CHEST  2 VIEW  COMPARISON:  None.  FINDINGS: The heart size and mediastinal contours are within normal limits. Both lungs are clear. There is a surgical screw which crosses the right scapula and the adjacent to the glenoid. Bony thorax is otherwise unremarkable.  IMPRESSION: No active cardiopulmonary disease.   Electronically Signed   By: Amie Portlandavid  Ormond M.D.   On: 06/16/2014 20:07    Scheduled Meds: . amLODipine  2.5 mg Oral Daily  . aspirin EC  81 mg Oral Daily  . docusate sodium  100 mg Oral BID  . folic acid  1 mg Oral Daily  . heparin  5,000 Units Subcutaneous 3 times per day  . insulin aspart  0-15 Units Subcutaneous TID WC  . insulin aspart  0-5 Units Subcutaneous QHS  . insulin aspart  6 Units Subcutaneous TID AC  . insulin detemir  21 Units Subcutaneous Daily  . metFORMIN  1,000 mg Oral BID WC  . multivitamin  1 tablet Oral Daily  . thiamine  100 mg Oral Daily

## 2014-06-18 NOTE — Discharge Instructions (Signed)
Diabetic Ketoacidosis °Diabetic ketoacidosis (DKA) is a life-threatening complication of type 1 diabetes. It must be quickly recognized and treated. Treatment requires hospitalization. °CAUSES  °When there is no insulin in the body, glucose (sugar) cannot be used, and the body breaks down fat for energy. When fat breaks down, acids (ketones) build up in the blood. Very high levels of glucose and high levels of acids lead to severe loss of body fluids (dehydration) and other dangerous chemical changes. This stresses your vital organs and can cause coma or death. °SIGNS AND SYMPTOMS  °· Tiredness (fatigue). °· Weight loss. °· Excessive thirst. °· Ketones in your urine. °· Light-headedness. °· Fruity or sweet smelling breath. °· Excessive urination. °· Visual changes. °· Confusion or irritability. °· Nausea or vomiting. °· Rapid breathing. °· Stomachache or abdominal pain. °DIAGNOSIS  °Your health care provider will diagnose DKA based on your history, physical exam, and blood tests. The health care provider will check to see if you have another illness that caused you to go into DKA. Most of this will be done quickly in an emergency room. °TREATMENT  °· Fluid replacement to correct dehydration. °· Insulin. °· Correction of electrolytes, such as potassium and sodium. °· Antibiotic medicines. °PREVENTION °· Always take your insulin. Do not skip your insulin injections. °· If you are sick, treat yourself quickly. Your body often needs more insulin to fight the illness. °· Check your blood glucose regularly. °· Check urine ketones if your blood glucose is greater than 240 milligrams per deciliter (mg/dL). °· Do not use outdated (expired) insulin. °· If your blood glucose is high, drink plenty of fluids. This helps flush out ketones. °HOME CARE INSTRUCTIONS  °· If you are sick, follow the advice of your health care provider. °· To prevent dehydration, drink enough water and fluids to keep your urine clear or pale  yellow. °¨ If you cannot eat, alternate between drinking fluids with sugar (soda, juices, flavored gelatin) and salty fluids (broth, bouillon). °¨ If you can eat, follow your usual diet and drink sugar-free liquids (water, diet drinks). °· Always take your usual dose of insulin. If you cannot eat or if your glucose is getting too low, call your health care provider for further instructions. °· Continue to monitor your blood or urine ketones every 3-4 hours around the clock. Set your alarm clock or have someone wake you up. If you are too sick, have someone test it for you. °· Rest and avoid exercise. °SEEK MEDICAL CARE IF:  °· You have a fever. °· You have ketones in your urine, or your blood glucose is higher than a level your health care provider suggests. You may need extra insulin. Call your health care provider if you need advice on adjusting your insulin. °· You cannot drink at least a tablespoon (15 mL) of fluid every 15-20 minutes. °· You have been vomiting for more than 2 hours. °· You have symptoms of DKA: °¨ Fruity smelling breath. °¨ Breathing faster or slower. °¨ Becoming very sleepy. °SEEK IMMEDIATE MEDICAL CARE IF:  °· You have signs of dehydration: °¨ Decreased urination. °¨ Increased thirst. °¨ Dry skin and mouth. °¨ Light-headedness. °· Your blood glucose is very high (as advised by your health care provider) twice in a row. °· You faint. °· You have chest pain or trouble breathing. °· You have a sudden, severe headache. °· You have sudden weakness in one arm or one leg. °· You have sudden trouble speaking or swallowing. °· You   have vomiting or diarrhea that is getting worse after 3 hours. °· You have abdominal pain. °MAKE SURE YOU:  °· Understand these instructions. °· Will watch your condition. °· Will get help right away if you are not doing well or get worse. °Document Released: 11/22/2000 Document Revised: 11/30/2013 Document Reviewed: 05/31/2009 °ExitCare® Patient Information ©2015 [employer]  [employer]. This information is not intended to replace advice given to you by your health care provider. Make sure you discuss any questions you have with your health care provider. ° °

## 2014-06-19 LAB — GLUCOSE, CAPILLARY
Glucose-Capillary: 38 mg/dL — CL (ref 70–99)
Glucose-Capillary: 49 mg/dL — ABNORMAL LOW (ref 70–99)
Glucose-Capillary: 85 mg/dL (ref 70–99)

## 2014-06-19 MED ORDER — INSULIN DETEMIR 100 UNIT/ML ~~LOC~~ SOLN
17.0000 [IU] | Freq: Every day | SUBCUTANEOUS | Status: DC
  Administered 2014-06-19: 17 [IU] via SUBCUTANEOUS
  Filled 2014-06-19: qty 0.17

## 2014-06-19 MED ORDER — AMLODIPINE BESYLATE 5 MG PO TABS
5.0000 mg | ORAL_TABLET | Freq: Every day | ORAL | Status: DC
  Administered 2014-06-19: 5 mg via ORAL
  Filled 2014-06-19 (×2): qty 1

## 2014-06-19 NOTE — Progress Notes (Signed)
Hypoglycemic Event  CBG: 38  Treatment: 4 oz OJ at 0130; repeated 4oz OJ and 1 graham cracker and 1/2 PB  Symptoms: Patient awakened requesting her blood sugar to be checked  Follow-up CBG: Time0137 CBG Result:49 recheck at 0209 85  Possible Reasons for Event: Levimer 21units given at 0951 on 06/18/14;  Novolog 6 units at 1300; Gluophage 1 gm given 1836. No correction given at supper or bedtime. Comments/MD notified:Tom Claiborne Billingsallahan NP for [facility] updated.Rounding MD will evaluate in AM. Wuill recheck at 0400, and PRN    Cheryl Carey, Cheryl Carey  Remember to initiate Hypoglycemia Order Set & completeAdult Hypoglycemia Protocol Treatment Guidelines  1. RN shall initiate Hypoglycemia Protocol emergency measures immediately when:            w        Routine or STAT CBG and/or a lab glucose indicates hypoglycemia (CBG < 70 mg/dl)  2. Treat the patient according to ability to take PO's and severity of hypoglycemia.   3. If patient is on GlucoStabilizer, follow directions provided by the [facility] for hypoglycemic events.  4. If patient on insulin pump, follow Hypoglycemia Protocol.  If patient requires more than one treatment have patient place pump in SUSPEND and notify MD.  DO NOT leave pump in SUSPEND for greater than 30 minutes unless ordered by MD.  A. Treatment for Mild or Moderate-Patient cooperative and able to swallow    1.  Patient taking PO's and can cooperate   a.  Give one of the following 15 gram CHO options:                           w     1 tube oral dextrose gel                           w     3-4 Glucose tablets                           w     4 oz. Juice                           w     4 oz. regular soda                                    ESRD patients:  clear, regular soda                           w     8 oz. skim milk    b.  Recheck CBG in 15 minutes after treatment                            w       If CBG < 70 mg/dl, repeat treatment and recheck until hypoglycemia  is resolved                            w       If CBG > 70 mg/dl and next meal is more than 1 hour away, give additional 15 grams CHO   2.  Patient NPO-Patient cooperative  and no altered mental status    a.  Give 25 ml of D50 IV.   b.  Recheck CBG in 15 minutes after treatment.                             w         If CBG is less than 70 mg/dl, repeat treatment and recheck until hypoglycemia is resolved.   c.  Notify MD for further orders.             SPECIAL CONSIDERATIONS:    a.  If no IV access,                              w        Start IV of D5W at [facility]                             w        Give 25 ml of D50 IV.    b.  If unable to gain IV access                             w          Give Glucagon IM:     i.  1 mg if patient weighs more than 45.5 kg     ii.  0.5 mg if patient weighs less than 45.5 kg   c.  Notify MD for further orders  B. Treatment for Severe-- Patient unconscious or unable to take PO's safely    1.  Position patient on side   2.  Give 50 ml D50 IV   3.  Recheck CBG in 15 minutes.                    w      If CBG is less than 70 mg/dl, repeat treatment and recheck until hypoglycemia is resolved.   4.  Notify MD for further orders.    SPECIAL CONSIDERATIONS:    a.  If no IV access                              w     Give Glucagon IM                                              i.  1 mg if patient weighs more than 45.5 kg                                             ii.  0.5 mg if patient weighs less than 45.5 kg                              w      Start IV of D5W at 50 ml/hr and give 50 ml D50 IV   b.  If no IV access and active seizure  w       Call Rapid Response   c.  If unable to gain IV access, give Glucagon IM:                              w          1 mg if patient weighs more than 45.5 kg                              w          0.5 mg if patient weighs less than 45.5 kg   d.  Notify MD for further  orders.  C. Complete smart text progress note to document intervention and follow-up CBG   1. In [facility] patient chart, click on Notes (left side of screen)   2. Create Progress Note   3. Click on [employer].  In the Match box type "hypo" and enter    4. Double click on CH[facility] IP HYPOGLYCEMIC EVENT and enter data   5. MD must be notified if patient is NPO or experienced severe hypoglycemia

## 2014-06-19 NOTE — Discharge Summary (Addendum)
Physician Discharge Summary  Cheryl Carey:[id number] DOB: [birth year] DOA: 06/16/2014  PCP: No primary provider on file.  Admit date: 06/16/2014 Discharge date: 06/19/2014  Recommendations for Outpatient Follow-up:  Before a meal (preprandial plasma glucose): 70-130   1-2 hours after beginning of the meal (Postprandial plasma glucose): Less than 180   See more at: www.diabetes.org/living-with-diabetes/treatment-and-care/blood-glucose-control/checking-your-blood-glucose.htm  CBG 201 - 250: 2 units      CBG 251 - 300: 3 units      CBG 351 - 400: 5 units      CBG 301 - 350: 4 units  Discharge Diagnoses:  Principal Problem:   DKA (diabetic ketoacidoses) Active Problems:   Hypertension    Discharge Condition: stable   Diet recommendation: as tolerated diabetic and heart healthy diet   History of present illness:  68 y.o. female with prior history of diabetes on insulin who presented to [facility] ED 06/16/2014 lightheadedness, near-syncope. Patient reports being compliant with insulin regimen and never experienced similar type of event. On admission, blood glucose was 897, she was found to be in DKA. She required admission to step down unit and insulin drip.   Assessment/Plan:   Principal Problem:  DKA (diabetic ketoacidoses)  Required insulin drip on admission. AG closed but CBG's still in 300-400 range so patient stayed in SDU 7/11.  CBG's now much better controlled. She will go home with insulin per prior home regimen because she said it works for her. She has appt on Monday with her endocrinologist for blood work and Tuesday she is seeng the endocrinologist for follow up.    Active Problems:  Hypertension  Added Norvasc 2.5 mg daily for better blood pressure control. Acute renal failure  No previous blood work for comparison. Her creatinine was 1.45 on the admission and is trending down with IV fluids to 1.30. This is likely pre-renal etiology, dehydration and DKA all seems to be  contributing factors. Anemia, normocytic  She is on folate supplement. She could also have anemia of chronic disease related to renal insufficiency. We don't have previous values for comparison.  Her hemoglobin is stable with no indications for transfusion. Hyperkalemia  Likely secondary to diabetic ketoacidosis. Has resolved with insulin. Hyponatremia  Likely due to dehydration. Has resolved with IV fluids.   DVT prophylaxis: Heparin subcutaneous while patient is in hospital    Code Status: full code  Family Communication: plan of care discussed with the patient   Consultants:  Diabetic coordinator Procedures:  None  Antibiotics:  None    Signed:  Manson Passey, MD  [facility] 06/19/2014, 10:45 AM  Pager #: [phone]   Discharge Exam: Filed Vitals:   06/19/14 0800  BP:   Pulse:   Temp: 97.6 F (36.4 C)  Resp:    Filed Vitals:   06/18/14 2336 06/19/14 0400 06/19/14 0611 06/19/14 0800  BP: 158/70 178/75 176/71   Pulse:      Temp:  97.1 F (36.2 C)  97.6 F (36.4 C)  TempSrc:  Oral  Oral  Resp: 14 20    Height:      Weight:  77.9 kg (171 lb 11.8 oz)    SpO2: 100% 100%      General: Pt is alert, follows commands appropriately, not in acute distress Cardiovascular: Regular rate and rhythm, S1/S2 +, no murmurs Respiratory: Clear to auscultation bilaterally, no wheezing, no crackles, no rhonchi Abdominal: Soft, non tender, non distended, bowel sounds +, no guarding Extremities: no edema, no cyanosis, pulses palpable bilaterally DP and  PT Neuro: Grossly nonfocal  Discharge Instructions  Discharge Instructions   Call MD for:  difficulty breathing, headache or visual disturbances    Complete by:  As directed      Call MD for:  difficulty breathing, headache or visual disturbances    Complete by:  As directed      Call MD for:  persistant dizziness or light-headedness    Complete by:  As directed      Call MD for:  persistant dizziness or  light-headedness    Complete by:  As directed      Call MD for:  persistant nausea and vomiting    Complete by:  As directed      Call MD for:  persistant nausea and vomiting    Complete by:  As directed      Call MD for:  severe uncontrolled pain    Complete by:  As directed      Call MD for:  severe uncontrolled pain    Complete by:  As directed      Diet - low sodium heart healthy    Complete by:  As directed      Diet - low sodium heart healthy    Complete by:  As directed      Diet - low sodium heart healthy    Complete by:  As directed      Discharge instructions    Complete by:  As directed   Before a meal (preprandial plasma glucose): 70-130   1-2 hours after beginning of the meal (Postprandial plasma glucose): Less than 180   See more at: www.diabetes.org/living-with-diabetes/treatment-and-care/blood-glucose-control/checking-your-blood-glucose.htm  CBG 201 - 250: 2 units      CBG 251 - 300: 3 units      CBG 351 - 400: 5 units      CBG 301 - 350: 4 units     Increase activity slowly    Complete by:  As directed      Increase activity slowly    Complete by:  As directed      Increase activity slowly    Complete by:  As directed             Medication List         amLODipine 2.5 MG tablet  Commonly known as:  NORVASC  Take 1 tablet (2.5 mg total) by mouth daily.     insulin aspart 100 UNIT/ML injection  Commonly known as:  novoLOG  Inject 0-15 Units into the skin 3 (three) times daily with meals.     insulin detemir 100 UNIT/ML injection  Commonly known as:  LEVEMIR  Inject 7-21 Units into the skin daily. 21 units in the morning and 7 in the evening     insulin lispro 100 UNIT/ML injection  Commonly known as:  HUMALOG  Inject 3-6 Units into the skin 3 (three) times daily before meals. 6 units before breakfast, 4 units before lunch and 2 units before dinner     metFORMIN 500 MG tablet  Commonly known as:  GLUCOPHAGE  Take 1,000 mg by mouth 2 (two) times  daily with a meal.          The results of significant diagnostics from this hospitalization (including imaging, microbiology, ancillary and laboratory) are listed below for reference.    Significant Diagnostic Studies: Dg Chest 2 View  06/16/2014   CLINICAL DATA:  Hyperglycemia.  Weakness.  EXAM: CHEST  2 VIEW  COMPARISON:  None.  FINDINGS: The  heart size and mediastinal contours are within normal limits. Both lungs are clear. There is a surgical screw which crosses the right scapula and the adjacent to the glenoid. Bony thorax is otherwise unremarkable.  IMPRESSION: No active cardiopulmonary disease.   Electronically Signed   By: Amie Portlandavid  Ormond M.D.   On: 06/16/2014 20:07    Microbiology: Recent Results (from the past 240 hour(s))  MRSA PCR SCREENING     Status: None   Collection Time    06/16/14 10:25 PM      Result Value Ref Range Status   MRSA by PCR NEGATIVE  NEGATIVE Final   Comment:            The GeneXpert MRSA Assay (FDA     approved for NASAL specimens     only), is one component of a     comprehensive MRSA colonization     surveillance program. It is not     intended to diagnose MRSA     infection nor to guide or     monitor treatment for     MRSA infections.     Labs: Basic Metabolic Panel:  Recent Labs Lab 06/16/14 1850 06/17/14 0340 06/18/14 1628  NA 132* 139 135*  K 5.9* 4.1 4.9  CL 89* 102 99  CO2 17* 24 25  GLUCOSE 897* 213* 423*  BUN 47* 39* 33*  CREATININE 1.45* 1.30* 1.30*  CALCIUM 9.5 9.0 9.3   Liver Function Tests:  Recent Labs Lab 06/16/14 1850 06/17/14 0340  AST 18 17  ALT 27 25  ALKPHOS 165* 108  BILITOT 0.4 0.3  PROT 7.0 6.3  ALBUMIN 3.5 3.1*   No results found for this basename: LIPASE, AMYLASE,  in the last 168 hours No results found for this basename: AMMONIA,  in the last 168 hours CBC:  Recent Labs Lab 06/16/14 1850 06/17/14 0340  WBC 11.2* 12.5*  HGB 11.5* 10.6*  HCT 36.7 31.8*  MCV 93.6 88.8  PLT 268 255    Cardiac Enzymes: No results found for this basename: CKTOTAL, CKMB, CKMBINDEX, TROPONINI,  in the last 168 hours BNP: BNP (last 3 results) No results found for this basename: PROBNP,  in the last 8760 hours CBG:  Recent Labs Lab 06/18/14 1627 06/18/14 2247 06/19/14 0127 06/19/14 0144 06/19/14 0210  GLUCAP 413* 117* 38* 49* 85    Time coordinating discharge: Over 30 minutes

## 2014-06-19 NOTE — Progress Notes (Signed)
Patient given discharge instructions and prescriptions. Patient verbalized understanding of instructions. All questions answered. Discharged to home via wheelchair at 1205.

## 2014-06-20 LAB — GLUCOSE, CAPILLARY
GLUCOSE-CAPILLARY: 118 mg/dL — AB (ref 70–99)
Glucose-Capillary: 132 mg/dL — ABNORMAL HIGH (ref 70–99)

## 2014-07-12 ENCOUNTER — Ambulatory Visit (INDEPENDENT_AMBULATORY_CARE_PROVIDER_SITE_OTHER): Payer: Medicare Other | Admitting: Emergency Medicine

## 2014-07-12 VITALS — BP 130/90 | HR 77 | Temp 98.1°F | Resp 16 | Ht 60.5 in | Wt 172.0 lb

## 2014-07-12 DIAGNOSIS — S46812A Strain of other muscles, fascia and tendons at shoulder and upper arm level, left arm, initial encounter: Secondary | ICD-10-CM

## 2014-07-12 DIAGNOSIS — S43499A Other sprain of unspecified shoulder joint, initial encounter: Secondary | ICD-10-CM

## 2014-07-12 DIAGNOSIS — S46819A Strain of other muscles, fascia and tendons at shoulder and upper arm level, unspecified arm, initial encounter: Secondary | ICD-10-CM

## 2014-07-12 MED ORDER — IBUPROFEN 600 MG PO TABS: 600.0000 mg | ORAL_TABLET | Freq: Three times a day (TID) | ORAL | Status: DC | PRN

## 2014-07-12 MED ORDER — TRAMADOL HCL 50 MG PO TABS: 50.0000 mg | ORAL_TABLET | Freq: Three times a day (TID) | ORAL | Status: DC | PRN

## 2014-07-12 MED ORDER — CYCLOBENZAPRINE HCL 5 MG PO TABS
5.0000 mg | ORAL_TABLET | Freq: Three times a day (TID) | ORAL | Status: DC | PRN
Start: 2014-07-12 — End: 2016-07-19

## 2014-07-12 NOTE — Patient Instructions (Signed)

## 2014-07-12 NOTE — Progress Notes (Signed)
[facility] [address], [place] [id number] [phone]  Date:  07/12/2014   Name:  Cheryl BaldingJoyce A Carey   DOB:  07-11-1946   MRN:  [id number]  PCP:  Alva GarnetSHELTON,KIMBERLY R., MD    Chief Complaint: Neck Pain   History of Present Illness:  Cheryl Carey is a 68 y.o. very pleasant female patient who presents with the following:  Fell asleep reading with her head on a pillow and woke up in the morning with pain in the left side of her neck on Saturday.  No neuro symptoms.  No radiation. No history of injury or overuse No improvement with over the counter medications or other home remedies. Denies other complaint or health concern today.   Patient Active Problem List   Diagnosis Date Noted  . DKA (diabetic ketoacidoses) 06/16/2014  . Hypertension 06/16/2014    Past Medical History  Diagnosis Date  . Hypertension   . Diabetes mellitus without complication     Past Surgical History  Procedure Laterality Date  . Abdominal hysterectomy    . Right shoulder surgery      History  Substance Use Topics  . Smoking status: Never Smoker   . Smokeless tobacco: Never Used  . Alcohol Use: No    History reviewed. No pertinent family history.  Allergies  Allergen Reactions  . Codeine Nausea Only    Medication list has been reviewed and updated.  Current Outpatient Prescriptions on File Prior to Visit  Medication Sig Dispense Refill  . amLODipine (NORVASC) 2.5 MG tablet Take 1 tablet (2.5 mg total) by mouth daily.  30 tablet  0  . insulin detemir (LEVEMIR) 100 UNIT/ML injection Inject 7-21 Units into the skin daily. 21 units in the morning and 7 in the evening      . insulin lispro (HUMALOG) 100 UNIT/ML injection Inject 3-6 Units into the skin 3 (three) times daily before meals. 6 units before breakfast, 4 units before lunch and 2 units before dinner      . metFORMIN (GLUCOPHAGE) 500 MG tablet Take 1,000 mg by mouth 2 (two) times daily with a meal.      .  insulin aspart (NOVOLOG) 100 UNIT/ML injection Inject 0-15 Units into the skin 3 (three) times daily with meals.  10 mL  0   No current facility-administered medications on file prior to visit.    Review of Systems:  As per HPI, otherwise negative.    Physical Examination: Filed Vitals:   07/12/14 1527  BP: 130/90  Pulse: 77  Temp: 98.1 F (36.7 C)  Resp: 16   Filed Vitals:   07/12/14 1527  Height: 5' 0.5" (1.537 m)  Weight: 172 lb (78.019 kg)   Body mass index is 33.03 kg/(m^2). Ideal Body Weight: Weight in (lb) to have BMI = 25: 129.9   GEN: WDWN, NAD, Non-toxic, Alert & Oriented x 3 HEENT: Atraumatic, Normocephalic.  Ears and Nose: No external deformity. EXTR: No clubbing/cyanosis/edema NEURO: Normal gait.  PSYCH: Normally interactive. Conversant. Not depressed or anxious appearing.  Calm demeanor.  NECK:  Left trapezius tender and spasm.  Assessment and Plan: Trapezius strain. Motrin Flexeril Ultram  Signed,  Phillips OdorJeffery Anderson, MD

## 2016-05-17 ENCOUNTER — Other Ambulatory Visit: Payer: Self-pay | Admitting: Neurosurgery

## 2016-07-09 HISTORY — PX: OTHER SURGICAL HISTORY: SHX169

## 2016-07-22 ENCOUNTER — Other Ambulatory Visit: Payer: Self-pay

## 2016-07-22 ENCOUNTER — Encounter (HOSPITAL_COMMUNITY)
Admission: RE | Admit: 2016-07-22 | Discharge: 2016-07-22 | Disposition: A | Discharge status: Home / Self Care | Payer: Medicare Other | Source: Ambulatory Visit | Attending: Neurosurgery | Admitting: Neurosurgery

## 2016-07-22 ENCOUNTER — Encounter (HOSPITAL_COMMUNITY): Payer: Self-pay

## 2016-07-22 ENCOUNTER — Other Ambulatory Visit (HOSPITAL_COMMUNITY): Payer: Self-pay

## 2016-07-22 DIAGNOSIS — Z01812 Encounter for preprocedural laboratory examination: Secondary | ICD-10-CM | POA: Insufficient documentation

## 2016-07-22 DIAGNOSIS — Z01818 Encounter for other preprocedural examination: Secondary | ICD-10-CM | POA: Insufficient documentation

## 2016-07-22 DIAGNOSIS — Z0183 Encounter for blood typing: Secondary | ICD-10-CM | POA: Diagnosis not present

## 2016-07-22 HISTORY — DX: Unspecified osteoarthritis, unspecified site: M19.90

## 2016-07-22 HISTORY — DX: Nausea with vomiting, unspecified: R11.2

## 2016-07-22 HISTORY — DX: Headache: R51

## 2016-07-22 HISTORY — DX: Other specified postprocedural states: Z98.890

## 2016-07-22 HISTORY — DX: Headache, unspecified: R51.9

## 2016-07-22 HISTORY — DX: Anemia, unspecified: D64.9

## 2016-07-22 LAB — CBC
HCT: 37.9 % (ref 36.0–46.0)
HEMOGLOBIN: 12.1 g/dL (ref 12.0–15.0)
MCH: 29.9 pg (ref 26.0–34.0)
MCHC: 31.9 g/dL (ref 30.0–36.0)
MCV: 93.6 fL (ref 78.0–100.0)
PLATELETS: 308 10*3/uL (ref 150–400)
RBC: 4.05 MIL/uL (ref 3.87–5.11)
RDW: 13.6 % (ref 11.5–15.5)
WBC: 6.6 10*3/uL (ref 4.0–10.5)

## 2016-07-22 LAB — GLUCOSE, CAPILLARY: GLUCOSE-CAPILLARY: 106 mg/dL — AB (ref 65–99)

## 2016-07-22 LAB — BASIC METABOLIC PANEL
ANION GAP: 9 (ref 5–15)
BUN: 24 mg/dL — ABNORMAL HIGH (ref 6–20)
CALCIUM: 10.1 mg/dL (ref 8.9–10.3)
CO2: 29 mmol/L (ref 22–32)
CREATININE: 1.32 mg/dL — AB (ref 0.44–1.00)
Chloride: 102 mmol/L (ref 101–111)
GFR, EST AFRICAN AMERICAN: 45 mL/min — AB (ref >60)
GFR, EST NON AFRICAN AMERICAN: 39 mL/min — AB (ref >60)
GLUCOSE: 52 mg/dL — AB (ref 65–99)
Potassium: 4.2 mmol/L (ref 3.5–5.1)
Sodium: 140 mmol/L (ref 135–145)

## 2016-07-22 LAB — ABO/RH: ABO/RH(D): O NEG

## 2016-07-22 LAB — SURGICAL PCR SCREEN
MRSA, PCR: NEGATIVE
Staphylococcus aureus: NEGATIVE

## 2016-07-22 LAB — TYPE AND SCREEN
ABO/RH(D): O NEG
ANTIBODY SCREEN: NEGATIVE

## 2016-07-22 NOTE — Pre-Procedure Instructions (Signed)
Cheryl Carey  07/22/2016      [id number]- [place], [place] - [address] [place] [place] [id number] Phone: [phone] Fax: [phone]    Your procedure is scheduled on Aug 21  [facility] at 530 A.M.  Call this number if you have problems the morning of surgery:  [phone]   Remember:  Do not eat food or drink liquids after midnight.  Take these medicines the morning of surgery with A SIP OF WATER Amlodipine (Norvasc)    Stop taking Aspirin, BC's, Goody's, Herbal medications, Fish Oil, Ibuprofen, Advil, Motrin, Aleve    How to Manage Your Diabetes Before and After Surgery  Why is it important to control my blood sugar before and after surgery? . Improving blood sugar levels before and after surgery helps healing and can limit problems. . A way of improving blood sugar control is eating a healthy diet by: o  Eating less sugar and carbohydrates o  Increasing activity/exercise o  Talking with your doctor about reaching your blood sugar goals . High blood sugars (greater than 180 mg/dL) can raise your risk of infections and slow your recovery, so you will need to focus on controlling your diabetes during the weeks before surgery. . Make sure that the doctor who takes care of your diabetes knows about your planned surgery including the date and location.  How do I manage my blood sugar before surgery? . Check your blood sugar at least 4 times a day, starting 2 days before surgery, to make sure that the level is not too high or low. o Check your blood sugar the morning of your surgery when you wake up and every 2 hours until you get to the Short Stay unit. . If your blood sugar is less than 70 mg/dL, you will need to treat for low blood sugar: o Do not take insulin. o Treat a low blood sugar (less than 70 mg/dL) with  cup of clear juice (cranberry or apple), 4 glucose tablets, OR glucose gel. o Recheck blood  sugar in 15 minutes after treatment (to make sure it is greater than 70 mg/dL). If your blood sugar is not greater than 70 mg/dL on recheck, call [phone] for further instructions. . Report your blood sugar to the short stay nurse when you get to Short Stay.  . If you are admitted to the hospital after surgery: o Your blood sugar will be checked by the staff and you will probably be given insulin after surgery (instead of oral diabetes medicines) to make sure you have good blood sugar levels. o The goal for blood sugar control after surgery is 80-180 mg/dL.      WHAT DO I DO ABOUT MY DIABETES MEDICATION?   Marland Kitchen. Do not take oral diabetes medicines (pills) the morning of surgery. Metformin (Glucophage)       . HE MORNING OF SURGERY, take  Tresiba units of  10 insulin.  . The day of surgery, do not take other diabetes injectables, including Byetta (exenatide), Bydureon (exenatide ER), Victoza (liraglutide), or Trulicity (dulaglutide).  . If your CBG is greater than 220 mg/dL, you may take  of your sliding scale (correction) dose of insulin.  Other Instructions:          Patient Signature:  Date:   Nurse Signature:  Date:   Reviewed and Endorsed by [facility] Patient Education Committee, August 2015  Do not wear jewelry, make-up or nail  polish.  Do not wear lotions, powders, or perfumes.  You may wear deoderant.  Do not shave 48 hours prior to surgery.  Men may shave face and neck.  Do not bring valuables to the hospital.  [facility] is not responsible for any belongings or valuables.  Contacts, dentures or bridgework may not be worn into surgery.  Leave your suitcase in the car.  After surgery it may be brought to your room.  For patients admitted to the hospital, discharge time will be determined by your treatment team.  Patients discharged the day of surgery will not be allowed to drive home.  Special instructions:  [name] - Preparing for Surgery  Before  surgery, you can play an important role.  Because skin is not sterile, your skin needs to be as free of germs as possible.  You can reduce the number of germs on you skin by washing with CHG (chlorahexidine gluconate) soap before surgery.  CHG is an antiseptic cleaner which kills germs and bonds with the skin to continue killing germs even after washing.  Please DO NOT use if you have an allergy to CHG or antibacterial soaps.  If your skin becomes reddened/irritated stop using the CHG and inform your nurse when you arrive at Short Stay.  Do not shave (including legs and underarms) for at least 48 hours prior to the first CHG shower.  You may shave your face.  Please follow these instructions carefully:   1.  Shower with CHG Soap the night before surgery and the     morning of Surgery.  2.  If you choose to wash your hair, wash your hair first as usual with your       normal shampoo.  3.  After you shampoo, rinse your hair and body thoroughly to remove the                      Shampoo.  4.  Use CHG as you would any other liquid soap.  You can apply chg directly       to the skin and wash gently with scrungie or a clean washcloth.  5.  Apply the CHG Soap to your body ONLY FROM THE NECK DOWN.        Do not use on open wounds or open sores.  Avoid contact with your eyes,       ears, mouth and genitals (private parts).  Wash genitals (private parts)       with your normal soap.  6.  Wash thoroughly, paying special attention to the area where your surgery        will be performed.  7.  Thoroughly rinse your body with warm water from the neck down.  8.  DO NOT shower/wash with your normal soap after using and rinsing off       the CHG Soap.  9.  Pat yourself dry with a clean towel.            10.  Wear clean pajamas.            11.  Place clean sheets on your bed the night of your first shower and do not        sleep with pets.  Day of Surgery  Do not apply any lotions/deoderants the morning of  surgery.  Please wear clean clothes to the hospital/surgery center.     Please read over the following fact sheets that you were given.  Pain Booklet, Coughing and Deep Breathing, MRSA Information and Surgical Site Infection Prevention

## 2016-07-22 NOTE — Progress Notes (Addendum)
PCP is Dr. Mila PalmerSharon Carey  Denies ever seeing a cardiologist. Denies ever having a card cath. States she might have had a stress test, but if she did it was many years ago. Reports her fasting cbg's run around 130's, but sometimes she runs low. Last A1c was 7.4- placed report  In chart Reports [facility].

## 2016-07-28 MED ORDER — DEXAMETHASONE SODIUM PHOSPHATE 10 MG/ML IJ SOLN
10.0000 mg | INTRAMUSCULAR | Status: AC
  Administered 2016-07-29: 5 mg via INTRAVENOUS
  Filled 2016-07-28: qty 1

## 2016-07-28 MED ORDER — CEFAZOLIN SODIUM-DEXTROSE 2-4 GM/100ML-% IV SOLN
2.0000 g | INTRAVENOUS | Status: AC
  Administered 2016-07-29: 2 g via INTRAVENOUS
  Filled 2016-07-28: qty 100

## 2016-07-29 ENCOUNTER — Inpatient Hospital Stay (HOSPITAL_COMMUNITY): Payer: Medicare Other | Admitting: Anesthesiology

## 2016-07-29 ENCOUNTER — Encounter (HOSPITAL_COMMUNITY): Payer: Self-pay | Admitting: *Deleted

## 2016-07-29 ENCOUNTER — Inpatient Hospital Stay (HOSPITAL_COMMUNITY): Payer: Medicare Other

## 2016-07-29 ENCOUNTER — Encounter (HOSPITAL_COMMUNITY)
Admission: RE | Disposition: A | Discharge status: Skilled Nursing Facility | Payer: Self-pay | Source: Ambulatory Visit | Attending: Neurosurgery

## 2016-07-29 ENCOUNTER — Inpatient Hospital Stay (HOSPITAL_COMMUNITY)
Admission: RE | Admit: 2016-07-29 | Discharge: 2016-08-02 | DRG: 460 | Disposition: A | Discharge status: Skilled Nursing Facility | Payer: Medicare Other | Source: Ambulatory Visit | Attending: Neurosurgery | Admitting: Neurosurgery

## 2016-07-29 DIAGNOSIS — Z419 Encounter for procedure for purposes other than remedying health state, unspecified: Secondary | ICD-10-CM

## 2016-07-29 DIAGNOSIS — E119 Type 2 diabetes mellitus without complications: Secondary | ICD-10-CM | POA: Diagnosis present

## 2016-07-29 DIAGNOSIS — Z794 Long term (current) use of insulin: Secondary | ICD-10-CM

## 2016-07-29 DIAGNOSIS — M4316 Spondylolisthesis, lumbar region: Secondary | ICD-10-CM | POA: Diagnosis present

## 2016-07-29 DIAGNOSIS — M79662 Pain in left lower leg: Secondary | ICD-10-CM | POA: Diagnosis present

## 2016-07-29 DIAGNOSIS — Z885 Allergy status to narcotic agent status: Secondary | ICD-10-CM | POA: Diagnosis not present

## 2016-07-29 DIAGNOSIS — M4806 Spinal stenosis, lumbar region: Secondary | ICD-10-CM | POA: Diagnosis present

## 2016-07-29 DIAGNOSIS — Z7982 Long term (current) use of aspirin: Secondary | ICD-10-CM

## 2016-07-29 DIAGNOSIS — I10 Essential (primary) hypertension: Secondary | ICD-10-CM | POA: Diagnosis present

## 2016-07-29 DIAGNOSIS — Z79899 Other long term (current) drug therapy: Secondary | ICD-10-CM | POA: Diagnosis not present

## 2016-07-29 DIAGNOSIS — M79609 Pain in unspecified limb: Secondary | ICD-10-CM | POA: Diagnosis not present

## 2016-07-29 DIAGNOSIS — M79604 Pain in right leg: Secondary | ICD-10-CM | POA: Diagnosis present

## 2016-07-29 LAB — GLUCOSE, CAPILLARY
GLUCOSE-CAPILLARY: 163 mg/dL — AB (ref 65–99)
GLUCOSE-CAPILLARY: 223 mg/dL — AB (ref 65–99)
GLUCOSE-CAPILLARY: 208 mg/dL — AB (ref 65–99)
GLUCOSE-CAPILLARY: 250 mg/dL — AB (ref 65–99)
Glucose-Capillary: 212 mg/dL — ABNORMAL HIGH (ref 65–99)
Glucose-Capillary: 183 mg/dL — ABNORMAL HIGH (ref 65–99)
Glucose-Capillary: 239 mg/dL — ABNORMAL HIGH (ref 65–99)

## 2016-07-29 SURGERY — POSTERIOR LUMBAR FUSION 1 LEVEL
Anesthesia: General | Site: Back

## 2016-07-29 MED ORDER — PHENYLEPHRINE HCL 10 MG/ML IJ SOLN
INTRAMUSCULAR | Status: DC | PRN
  Administered 2016-07-29: 120 ug via INTRAVENOUS

## 2016-07-29 MED ORDER — FLAXSEED OIL 1000 MG PO CAPS: 1400.0000 mg | ORAL_CAPSULE | Freq: Every day | ORAL | Status: DC

## 2016-07-29 MED ORDER — THROMBIN 20000 UNITS EX SOLR
CUTANEOUS | Status: DC | PRN
  Administered 2016-07-29: 09:00:00 via TOPICAL

## 2016-07-29 MED ORDER — METFORMIN HCL 500 MG PO TABS
1000.0000 mg | ORAL_TABLET | Freq: Two times a day (BID) | ORAL | Status: DC
  Administered 2016-07-29 – 2016-08-02 (×8): 1000 mg via ORAL
  Filled 2016-07-29 (×8): qty 2

## 2016-07-29 MED ORDER — VANCOMYCIN HCL 1000 MG IV SOLR
INTRAVENOUS | Status: AC
  Filled 2016-07-29: qty 1000

## 2016-07-29 MED ORDER — POLYVINYL ALCOHOL 1.4 % OP SOLN
1.0000 [drp] | OPHTHALMIC | Status: DC | PRN
  Administered 2016-07-29: 1 [drp] via OPHTHALMIC
  Filled 2016-07-29: qty 15

## 2016-07-29 MED ORDER — SODIUM CHLORIDE 0.9% FLUSH
3.0000 mL | INTRAVENOUS | Status: DC | PRN
  Administered 2016-07-31: 3 mL via INTRAVENOUS
  Filled 2016-07-29: qty 3

## 2016-07-29 MED ORDER — SODIUM CHLORIDE 0.9% FLUSH
3.0000 mL | Freq: Two times a day (BID) | INTRAVENOUS | Status: DC
  Administered 2016-07-29 – 2016-08-01 (×3): 3 mL via INTRAVENOUS

## 2016-07-29 MED ORDER — SODIUM CHLORIDE 0.9 % IV SOLN: 250.0000 mL | INTRAVENOUS | Status: DC

## 2016-07-29 MED ORDER — DEXTROSE 5 % IV SOLN
INTRAVENOUS | Status: DC | PRN
  Administered 2016-07-29: 50 ug/min via INTRAVENOUS

## 2016-07-29 MED ORDER — POTASSIUM CHLORIDE IN NACL 20-0.45 MEQ/L-% IV SOLN
INTRAVENOUS | Status: DC
Start: 2016-07-29 — End: 2016-08-02
  Administered 2016-07-29: 15:00:00 via INTRAVENOUS
  Filled 2016-07-29 (×2): qty 1000

## 2016-07-29 MED ORDER — CYCLOBENZAPRINE HCL 10 MG PO TABS
10.0000 mg | ORAL_TABLET | Freq: Three times a day (TID) | ORAL | Status: DC | PRN
  Administered 2016-07-29 – 2016-08-02 (×8): 10 mg via ORAL
  Filled 2016-07-29 (×8): qty 1

## 2016-07-29 MED ORDER — MAGNESIUM 200 MG PO TABS
100.0000 mg | ORAL_TABLET | Freq: Every day | ORAL | Status: DC
  Administered 2016-07-29: 100 mg via ORAL
  Filled 2016-07-29 (×3): qty 1

## 2016-07-29 MED ORDER — FENTANYL CITRATE (PF) 100 MCG/2ML IJ SOLN
INTRAMUSCULAR | Status: AC
  Filled 2016-07-29: qty 4

## 2016-07-29 MED ORDER — FENTANYL CITRATE (PF) 100 MCG/2ML IJ SOLN
INTRAMUSCULAR | Status: DC | PRN
  Administered 2016-07-29: 100 ug via INTRAVENOUS
  Administered 2016-07-29 (×2): 50 ug via INTRAVENOUS

## 2016-07-29 MED ORDER — SODIUM CHLORIDE 0.9 % IR SOLN
Status: DC | PRN
  Administered 2016-07-29: 09:00:00

## 2016-07-29 MED ORDER — ONDANSETRON HCL 4 MG/2ML IJ SOLN
INTRAMUSCULAR | Status: AC
  Filled 2016-07-29: qty 2

## 2016-07-29 MED ORDER — ROCURONIUM BROMIDE 100 MG/10ML IV SOLN
INTRAVENOUS | Status: DC | PRN
  Administered 2016-07-29: 50 mg via INTRAVENOUS
  Administered 2016-07-29: 30 mg via INTRAVENOUS

## 2016-07-29 MED ORDER — INSULIN ASPART 100 UNIT/ML ~~LOC~~ SOLN
0.0000 [IU] | Freq: Every day | SUBCUTANEOUS | Status: DC
  Administered 2016-07-29: 2 [IU] via SUBCUTANEOUS
  Administered 2016-07-30 – 2016-07-31 (×2): 4 [IU] via SUBCUTANEOUS
  Administered 2016-08-01: 2 [IU] via SUBCUTANEOUS

## 2016-07-29 MED ORDER — FENTANYL CITRATE (PF) 100 MCG/2ML IJ SOLN
25.0000 ug | INTRAMUSCULAR | Status: DC | PRN
  Administered 2016-07-29 (×2): 25 ug via INTRAVENOUS
  Administered 2016-07-29 (×2): 50 ug via INTRAVENOUS

## 2016-07-29 MED ORDER — SUGAMMADEX SODIUM 200 MG/2ML IV SOLN
INTRAVENOUS | Status: AC
  Filled 2016-07-29: qty 2

## 2016-07-29 MED ORDER — HYDROMORPHONE HCL 1 MG/ML IJ SOLN
0.5000 mg | INTRAMUSCULAR | Status: DC | PRN
  Administered 2016-07-29 – 2016-07-30 (×5): 1 mg via INTRAVENOUS
  Administered 2016-07-30: 0.5 mg via INTRAVENOUS
  Administered 2016-07-31: 1 mg via INTRAVENOUS
  Filled 2016-07-29 (×7): qty 1

## 2016-07-29 MED ORDER — DEXAMETHASONE SODIUM PHOSPHATE 10 MG/ML IJ SOLN
INTRAMUSCULAR | Status: AC
  Filled 2016-07-29: qty 1

## 2016-07-29 MED ORDER — INSULIN DEGLUDEC 200 UNIT/ML ~~LOC~~ SOPN: 20.0000 [IU] | PEN_INJECTOR | Freq: Every day | SUBCUTANEOUS | Status: DC

## 2016-07-29 MED ORDER — LIDOCAINE-EPINEPHRINE 1 %-1:100000 IJ SOLN
INTRAMUSCULAR | Status: DC | PRN
  Administered 2016-07-29: 10 mL

## 2016-07-29 MED ORDER — MILK THISTLE 175 MG PO TABS: 175.0000 mg | ORAL_TABLET | Freq: Every day | ORAL | Status: DC

## 2016-07-29 MED ORDER — ONDANSETRON HCL 4 MG/2ML IJ SOLN
4.0000 mg | INTRAMUSCULAR | Status: DC | PRN
  Administered 2016-07-30: 4 mg via INTRAVENOUS
  Filled 2016-07-29: qty 2

## 2016-07-29 MED ORDER — ASPIRIN EC 81 MG PO TBEC
81.0000 mg | DELAYED_RELEASE_TABLET | Freq: Every day | ORAL | Status: DC
  Administered 2016-07-29 – 2016-08-02 (×5): 81 mg via ORAL
  Filled 2016-07-29 (×5): qty 1

## 2016-07-29 MED ORDER — AMLODIPINE BESYLATE 10 MG PO TABS
10.0000 mg | ORAL_TABLET | Freq: Every day | ORAL | Status: DC
  Administered 2016-07-30 – 2016-08-02 (×2): 10 mg via ORAL
  Filled 2016-07-29 (×4): qty 1

## 2016-07-29 MED ORDER — CEFAZOLIN SODIUM-DEXTROSE 2-4 GM/100ML-% IV SOLN
2.0000 g | Freq: Three times a day (TID) | INTRAVENOUS | Status: AC
  Administered 2016-07-29 – 2016-07-31 (×6): 2 g via INTRAVENOUS
  Filled 2016-07-29 (×6): qty 100

## 2016-07-29 MED ORDER — ACETAMINOPHEN 325 MG PO TABS: 650.0000 mg | ORAL_TABLET | ORAL | Status: DC | PRN

## 2016-07-29 MED ORDER — MENTHOL 3 MG MT LOZG: 1.0000 | LOZENGE | OROMUCOSAL | Status: DC | PRN

## 2016-07-29 MED ORDER — VANCOMYCIN HCL 1000 MG IV SOLR
INTRAVENOUS | Status: DC | PRN
  Administered 2016-07-29: 1000 mg via TOPICAL

## 2016-07-29 MED ORDER — FENTANYL CITRATE (PF) 100 MCG/2ML IJ SOLN
INTRAMUSCULAR | Status: AC
  Administered 2016-07-29: 25 ug via INTRAVENOUS
  Filled 2016-07-29: qty 2

## 2016-07-29 MED ORDER — ONDANSETRON HCL 4 MG/2ML IJ SOLN
4.0000 mg | Freq: Once | INTRAMUSCULAR | Status: AC | PRN
  Administered 2016-07-29: 4 mg via INTRAVENOUS

## 2016-07-29 MED ORDER — ONDANSETRON HCL 4 MG/2ML IJ SOLN
INTRAMUSCULAR | Status: DC | PRN
  Administered 2016-07-29 (×2): 4 mg via INTRAVENOUS

## 2016-07-29 MED ORDER — BUPIVACAINE LIPOSOME 1.3 % IJ SUSP
INTRAMUSCULAR | Status: DC | PRN
  Administered 2016-07-29: 20 mL

## 2016-07-29 MED ORDER — FENTANYL CITRATE (PF) 100 MCG/2ML IJ SOLN
INTRAMUSCULAR | Status: AC
  Administered 2016-07-29: 50 ug via INTRAVENOUS
  Filled 2016-07-29: qty 2

## 2016-07-29 MED ORDER — INSULIN ASPART 100 UNIT/ML ~~LOC~~ SOLN
SUBCUTANEOUS | Status: DC
Start: 2016-07-29 — End: 2016-07-29
  Filled 2016-07-29: qty 1

## 2016-07-29 MED ORDER — BISACODYL 5 MG PO TBEC: 5.0000 mg | DELAYED_RELEASE_TABLET | Freq: Every day | ORAL | Status: DC | PRN

## 2016-07-29 MED ORDER — LORATADINE 10 MG PO TABS: 10.0000 mg | ORAL_TABLET | Freq: Every day | ORAL | Status: DC | PRN

## 2016-07-29 MED ORDER — PROPOFOL 10 MG/ML IV BOLUS
INTRAVENOUS | Status: AC
  Filled 2016-07-29: qty 20

## 2016-07-29 MED ORDER — BUPIVACAINE LIPOSOME 1.3 % IJ SUSP
20.0000 mL | Freq: Once | INTRAMUSCULAR | Status: DC
Start: 2016-07-29 — End: 2016-08-02
  Filled 2016-07-29: qty 20

## 2016-07-29 MED ORDER — LACTATED RINGERS IV SOLN
INTRAVENOUS | Status: DC | PRN
  Administered 2016-07-29 (×2): via INTRAVENOUS

## 2016-07-29 MED ORDER — PROPOFOL 10 MG/ML IV BOLUS
INTRAVENOUS | Status: DC | PRN
  Administered 2016-07-29: 100 mg via INTRAVENOUS

## 2016-07-29 MED ORDER — 0.9 % SODIUM CHLORIDE (POUR BTL) OPTIME
TOPICAL | Status: DC | PRN
  Administered 2016-07-29: 1000 mL

## 2016-07-29 MED ORDER — SUGAMMADEX SODIUM 200 MG/2ML IV SOLN
INTRAVENOUS | Status: DC | PRN
  Administered 2016-07-29: 150 mg via INTRAVENOUS

## 2016-07-29 MED ORDER — SENNOSIDES-DOCUSATE SODIUM 8.6-50 MG PO TABS
1.0000 | ORAL_TABLET | Freq: Every evening | ORAL | Status: DC | PRN
Start: 2016-07-29 — End: 2016-08-02
  Administered 2016-08-01: 1 via ORAL
  Filled 2016-07-29: qty 1

## 2016-07-29 MED ORDER — QUINAPRIL HCL 10 MG PO TABS
40.0000 mg | ORAL_TABLET | Freq: Every day | ORAL | Status: DC
  Administered 2016-07-29: 40 mg via ORAL
  Filled 2016-07-29 (×2): qty 4

## 2016-07-29 MED ORDER — PHENYLEPHRINE 40 MCG/ML (10ML) SYRINGE FOR IV PUSH (FOR BLOOD PRESSURE SUPPORT)
PREFILLED_SYRINGE | INTRAVENOUS | Status: AC
  Filled 2016-07-29: qty 10

## 2016-07-29 MED ORDER — LIDOCAINE HCL (CARDIAC) 20 MG/ML IV SOLN
INTRAVENOUS | Status: DC | PRN
  Administered 2016-07-29: 40 mg via INTRAVENOUS
  Administered 2016-07-29: 60 mg via INTRAVENOUS

## 2016-07-29 MED ORDER — ROCURONIUM BROMIDE 10 MG/ML (PF) SYRINGE
PREFILLED_SYRINGE | INTRAVENOUS | Status: AC
  Filled 2016-07-29: qty 10

## 2016-07-29 MED ORDER — PHENOL 1.4 % MT LIQD: 1.0000 | OROMUCOSAL | Status: DC | PRN

## 2016-07-29 MED ORDER — LIDOCAINE 2% (20 MG/ML) 5 ML SYRINGE
INTRAMUSCULAR | Status: AC
  Filled 2016-07-29: qty 5

## 2016-07-29 MED ORDER — INSULIN ASPART 100 UNIT/ML ~~LOC~~ SOLN
0.0000 [IU] | Freq: Three times a day (TID) | SUBCUTANEOUS | Status: DC
  Administered 2016-07-29 – 2016-07-30 (×2): 5 [IU] via SUBCUTANEOUS

## 2016-07-29 MED ORDER — INSULIN ASPART 100 UNIT/ML ~~LOC~~ SOLN
SUBCUTANEOUS | Status: DC | PRN
  Administered 2016-07-29 (×2): 4 [IU] via SUBCUTANEOUS

## 2016-07-29 MED ORDER — ACETAMINOPHEN 650 MG RE SUPP: 650.0000 mg | RECTAL | Status: DC | PRN

## 2016-07-29 MED ORDER — MIDAZOLAM HCL 2 MG/2ML IJ SOLN
INTRAMUSCULAR | Status: AC
  Filled 2016-07-29: qty 2

## 2016-07-29 SURGICAL SUPPLY — 73 items
BAG DECANTER FOR FLEXI CONT (MISCELLANEOUS) ×3 IMPLANT
BENZOIN TINCTURE PRP APPL 2/3 (GAUZE/BANDAGES/DRESSINGS) ×3 IMPLANT
BIT DRILL 5.0/4.0 (BIT) ×1 IMPLANT
BLADE CLIPPER SURG (BLADE) IMPLANT
BLADE SURG 11 STRL SS (BLADE) ×3 IMPLANT
BRUSH SCRUB EZ PLAIN DRY (MISCELLANEOUS) ×3 IMPLANT
BUR MATCHSTICK NEURO 3.0 LAGG (BURR) ×3 IMPLANT
BUR PRECISION FLUTE 6.0 (BURR) ×3 IMPLANT
CANISTER SUCT 3000ML PPV (MISCELLANEOUS) ×3 IMPLANT
CAP LOCKING (Cap) ×8 IMPLANT
CAP LOCKING 5.5 CREO (Cap) ×4 IMPLANT
CATH FOLEY 2WAY SLVR  5CC 12FR (CATHETERS) ×2
CATH FOLEY 2WAY SLVR 5CC 12FR (CATHETERS) ×1 IMPLANT
CLOSURE WOUND 1/2 X4 (GAUZE/BANDAGES/DRESSINGS) ×1
CONT SPEC 4OZ CLIKSEAL STRL BL (MISCELLANEOUS) ×3 IMPLANT
COVER BACK TABLE 60X90IN (DRAPES) ×3 IMPLANT
DECANTER SPIKE VIAL GLASS SM (MISCELLANEOUS) ×3 IMPLANT
DRAPE C-ARM 42X72 X-RAY (DRAPES) ×3 IMPLANT
DRAPE C-ARMOR (DRAPES) ×3 IMPLANT
DRAPE HALF SHEET 40X57 (DRAPES) IMPLANT
DRAPE LAPAROTOMY 100X72X124 (DRAPES) ×3 IMPLANT
DRAPE POUCH INSTRU U-SHP 10X18 (DRAPES) ×3 IMPLANT
DRAPE SURG 17X23 STRL (DRAPES) ×3 IMPLANT
DRILL 5.0/4.0 (BIT) ×3
DRSG OPSITE 4X5.5 SM (GAUZE/BANDAGES/DRESSINGS) ×3 IMPLANT
DRSG OPSITE POSTOP 4X6 (GAUZE/BANDAGES/DRESSINGS) ×3 IMPLANT
DURAPREP 26ML APPLICATOR (WOUND CARE) ×3 IMPLANT
ELECT BLADE 4.0 EZ CLEAN MEGAD (MISCELLANEOUS) ×3
ELECT REM PT RETURN 9FT ADLT (ELECTROSURGICAL) ×3
ELECTRODE BLDE 4.0 EZ CLN MEGD (MISCELLANEOUS) ×1 IMPLANT
ELECTRODE REM PT RTRN 9FT ADLT (ELECTROSURGICAL) ×1 IMPLANT
EVACUATOR 3/16  PVC DRAIN (DRAIN) ×4
EVACUATOR 3/16 PVC DRAIN (DRAIN) ×2 IMPLANT
GAUZE SPONGE 4X4 12PLY STRL (GAUZE/BANDAGES/DRESSINGS) ×3 IMPLANT
GAUZE SPONGE 4X4 16PLY XRAY LF (GAUZE/BANDAGES/DRESSINGS) IMPLANT
GLOVE BIO SURGEON STRL SZ8 (GLOVE) ×6 IMPLANT
GLOVE ECLIPSE 7.5 STRL STRAW (GLOVE) IMPLANT
GLOVE EXAM NITRILE LRG STRL (GLOVE) IMPLANT
GLOVE EXAM NITRILE MD LF STRL (GLOVE) IMPLANT
GLOVE EXAM NITRILE XL STR (GLOVE) IMPLANT
GLOVE EXAM NITRILE XS STR PU (GLOVE) IMPLANT
GLOVE INDICATOR 8.5 STRL (GLOVE) ×6 IMPLANT
GOWN STRL REUS W/ TWL LRG LVL3 (GOWN DISPOSABLE) IMPLANT
GOWN STRL REUS W/ TWL XL LVL3 (GOWN DISPOSABLE) ×2 IMPLANT
GOWN STRL REUS W/TWL 2XL LVL3 (GOWN DISPOSABLE) IMPLANT
GOWN STRL REUS W/TWL LRG LVL3 (GOWN DISPOSABLE)
GOWN STRL REUS W/TWL XL LVL3 (GOWN DISPOSABLE) ×4
KIT BASIN OR (CUSTOM PROCEDURE TRAY) ×3 IMPLANT
KIT INFUSE XX SMALL 0.7CC (Orthopedic Implant) ×3 IMPLANT
KIT ROOM TURNOVER OR (KITS) ×3 IMPLANT
LIQUID BAND (GAUZE/BANDAGES/DRESSINGS) ×3 IMPLANT
NEEDLE HYPO 21X1.5 SAFETY (NEEDLE) ×3 IMPLANT
NEEDLE HYPO 25X1 1.5 SAFETY (NEEDLE) ×3 IMPLANT
NS IRRIG 1000ML POUR BTL (IV SOLUTION) ×3 IMPLANT
PACK LAMINECTOMY NEURO (CUSTOM PROCEDURE TRAY) ×3 IMPLANT
PAD ARMBOARD 7.5X6 YLW CONV (MISCELLANEOUS) ×9 IMPLANT
PUTTY BONE DBX 5CC MIX (Putty) ×3 IMPLANT
ROD 40MM SPINAL (Rod) ×6 IMPLANT
SHAFT CREO 30MM (Neuro Prosthesis/Implant) ×12 IMPLANT
SPACER SUSTAIN O SML 10X22 11M (Peek) ×6 IMPLANT
SPONGE LAP 4X18 X RAY DECT (DISPOSABLE) IMPLANT
SPONGE SURGIFOAM ABS GEL 100 (HEMOSTASIS) ×3 IMPLANT
STRIP CLOSURE SKIN 1/2X4 (GAUZE/BANDAGES/DRESSINGS) ×2 IMPLANT
SUT VIC AB 0 CT1 18XCR BRD8 (SUTURE) ×2 IMPLANT
SUT VIC AB 0 CT1 8-18 (SUTURE) ×4
SUT VIC AB 2-0 CT1 18 (SUTURE) ×3 IMPLANT
SUT VIC AB 4-0 PS2 27 (SUTURE) ×3 IMPLANT
SYR 20CC LL (SYRINGE) ×3 IMPLANT
TOWEL OR 17X24 6PK STRL BLUE (TOWEL DISPOSABLE) ×3 IMPLANT
TOWEL OR 17X26 10 PK STRL BLUE (TOWEL DISPOSABLE) ×3 IMPLANT
TRAY FOLEY W/METER SILVER 16FR (SET/KITS/TRAYS/PACK) ×3 IMPLANT
TULIP CREP AMP 5.5MM (Orthopedic Implant) ×12 IMPLANT
WATER STERILE IRR 1000ML POUR (IV SOLUTION) ×3 IMPLANT

## 2016-07-29 NOTE — Progress Notes (Signed)
Pt arrived to 5C08 via stretcher.  Pt alert and oriented, c/o pain 6/10.  Honeycomb to back clean dry and intact, hemovac to suction.  VSS.  Will continue to monitor.  Sondra ComeSilva, Rachyl Wuebker M, RN

## 2016-07-29 NOTE — Anesthesia Procedure Notes (Signed)
Procedure Name: Intubation Date/Time: 07/29/2016 7:40 AM Performed by: Kyung Rudd Pre-anesthesia Checklist: Patient identified, Emergency Drugs available, Suction available, Patient being monitored and Timeout performed Patient Re-evaluated:Patient Re-evaluated prior to inductionOxygen Delivery Method: Circle system utilized Preoxygenation: Pre-oxygenation with 100% oxygen Intubation Type: IV induction and Cricoid Pressure applied Ventilation: Mask ventilation without difficulty Laryngoscope Size: Mac and 3 Grade View: Grade III Tube type: Oral Tube size: 7.0 mm Number of attempts: 1 Airway Equipment and Method: Stylet and LTA kit utilized Placement Confirmation: positive ETCO2 and breath sounds checked- equal and bilateral Secured at: 21 cm Tube secured with: Tape Dental Injury: Teeth and Oropharynx as per pre-operative assessment

## 2016-07-29 NOTE — Anesthesia Preprocedure Evaluation (Addendum)
Anesthesia Evaluation  Patient identified by MRN, date of birth, ID band Patient awake    Reviewed: Allergy & Precautions, NPO status , Patient's Chart, lab work & pertinent test results  History of Anesthesia Complications (+) PONV and history of anesthetic complications  Airway Mallampati: III  TM Distance: >3 FB Neck ROM: Full    Dental  (+) Teeth Intact, Dental Advisory Given   Pulmonary neg pulmonary ROS,    Pulmonary exam normal breath sounds clear to auscultation       Cardiovascular Exercise Tolerance: Good hypertension, Pt. on medications Normal cardiovascular exam Rhythm:Regular Rate:Normal     Neuro/Psych  Headaches, negative psych ROS   GI/Hepatic negative GI ROS, Neg liver ROS,   Endo/Other  diabetes, Type 2, Insulin Dependent, Oral Hypoglycemic AgentsObesity   Renal/GU Renal InsufficiencyRenal disease     Musculoskeletal  (+) Arthritis , Osteoarthritis,    Abdominal   Peds  Hematology negative hematology ROS (+)   Anesthesia Other Findings Day of surgery medications reviewed with the patient.  Reproductive/Obstetrics                            Anesthesia Physical Anesthesia Plan  ASA: III  Anesthesia Plan: General   Post-op Pain Management:    Induction: Intravenous  Airway Management Planned: Oral ETT  Additional Equipment:   Intra-op Plan:   Post-operative Plan: Extubation in OR  Informed Consent: I have reviewed the patients History and Physical, chart, labs and discussed the procedure including the risks, benefits and alternatives for the proposed anesthesia with the patient or authorized representative who has indicated his/her understanding and acceptance.   Dental advisory given  Plan Discussed with: CRNA  Anesthesia Plan Comments: (Risks/benefits of general anesthesia discussed with patient including risk of damage to teeth, lips, gum, and tongue,  nausea/vomiting, allergic reactions to medications, and the possibility of heart attack, stroke and death.  All patient questions answered.  Patient wishes to proceed.)        Anesthesia Quick Evaluation

## 2016-07-29 NOTE — H&P (Signed)
Cheryl Carey is an 70 y.o. female.   Chief Complaint: Back and right leg pain HPI: Patient is very pleasant 70 year old female with long-standing back and right greater than left leg pain radiating down L4 and L5 nerve root pattern. Workup revealed a grade 1 spondylolisthesis with motion a dynamic films fluid in the facet joints and instability. Due to her failure conservative treatment imaging findings and progressive clinical syndrome I recommended posterior lumbar decompression and fusion L4-5. I extensively reviewed the risks and benefits of the operation the patient as well as perioperative course expectations of outcome and alternatives to surgery she understands and agrees to proceed forward.  Past Medical History:  Diagnosis Date  . Anemia    hx  . Arthritis   . Diabetes mellitus without complication (HCC)   . Headache   . Hypertension   . PONV (postoperative nausea and vomiting)     Past Surgical History:  Procedure Laterality Date  . ABDOMINAL HYSTERECTOMY    . COLONOSCOPY    . DILATION AND CURETTAGE OF UTERUS    . Right shoulder surgery      History reviewed. No pertinent family history. Social History:  reports that she has never smoked. She has never used smokeless tobacco. She reports that she does not drink alcohol or use drugs.  Allergies:  Allergies  Allergen Reactions  . Codeine Nausea Only    Medications Prior to Admission  Medication Sig Dispense Refill  . amLODipine (NORVASC) 10 MG tablet Take 10 mg by mouth daily.    Marland Kitchen. aspirin 81 MG tablet Take 81 mg by mouth daily.    . Flaxseed, Linseed, (FLAXSEED OIL PO) Take 1,400 mg by mouth daily.    Marland Kitchen. ibuprofen (ADVIL,MOTRIN) 600 MG tablet Take 1 tablet (600 mg total) by mouth every 8 (eight) hours as needed. (Patient taking differently: Take 600 mg by mouth every 8 (eight) hours as needed for mild pain. ) 30 tablet 0  . Insulin Degludec (TRESIBA FLEXTOUCH) 200 UNIT/ML SOPN Inject 20 Units into the skin daily.     . insulin lispro (HUMALOG) 100 UNIT/ML injection Inject 2-6 Units into the skin See admin instructions. 6 units before breakfast, 2 units in evening    . loratadine (CLARITIN) 10 MG tablet Take 10 mg by mouth daily as needed for allergies.    . magnesium 30 MG tablet Take 30 mg by mouth daily.     . metFORMIN (GLUCOPHAGE) 500 MG tablet Take 1,000 mg by mouth 2 (two) times daily with a meal.    . milk thistle 175 MG tablet Take 175 mg by mouth daily.    . Multiple Vitamins-Minerals (MULTI COMPLETE PO) Take by mouth.    . quinapril (ACCUPRIL) 40 MG tablet Take 40 mg by mouth daily.      Results for orders placed or performed during the hospital encounter of 07/29/16 (from the past 48 hour(s))  Glucose, capillary     Status: Abnormal   Collection Time: 07/29/16  6:34 AM  Result Value Ref Range   Glucose-Capillary 163 (H) 65 - 99 mg/dL   No results found.  Review of Systems  Constitutional: Negative.   HENT: Negative.   Eyes: Negative.   Respiratory: Negative.   Cardiovascular: Negative.   Gastrointestinal: Negative.   Genitourinary: Negative.   Musculoskeletal: Positive for back pain, joint pain and myalgias.  Skin: Negative.   Neurological: Negative.   Psychiatric/Behavioral: Negative.     Blood pressure (!) 173/79, pulse 75, temperature 98.3 F (  36.8 C), temperature source Oral, resp. rate 20, height 5\' 1"  (1.549 m), weight 82.1 kg (181 lb), SpO2 100 %. Physical Exam  Constitutional: She is oriented to person, place, and time. She appears well-developed and well-nourished.  HENT:  Head: Normocephalic.  Eyes: Pupils are equal, round, and reactive to light.  Neck: Normal range of motion.  Respiratory: Effort normal.  GI: Soft. Bowel sounds are normal.  Neurological: She is alert and oriented to person, place, and time. She has normal strength. GCS eye subscore is 4. GCS verbal subscore is 5. GCS motor subscore is 6.  Patient is awake alert strength is 5 out of 5 in her  iliopsoas, quads, hamstrings, gastric, into tibialis, and EHL.     Assessment/Plan 70 year old female presents for decompression fusion L4-5.  Cheryl Carey P, MD 07/29/2016, 7:20 AM

## 2016-07-29 NOTE — Anesthesia Postprocedure Evaluation (Signed)
Anesthesia Post Note  Patient: Cheryl Carey  Procedure(s) Performed: Procedure(s) (LRB): Posterior Laterial Interbody Fusion - Lumbar four-Lumbar five (N/A)  Patient location during evaluation: PACU Anesthesia Type: General Level of consciousness: awake and alert Pain management: pain level controlled Vital Signs Assessment: post-procedure vital signs reviewed and stable Respiratory status: spontaneous breathing, nonlabored ventilation, respiratory function stable and patient connected to nasal cannula oxygen Cardiovascular status: blood pressure returned to baseline and stable Postop Assessment: no signs of nausea or vomiting Anesthetic complications: no    Last Vitals:  Vitals:   07/29/16 1235 07/29/16 1249  BP: 119/64 126/61  Pulse: 66 68  Resp: (!) 21 16  Temp:      Last Pain:  Vitals:   07/29/16 1200  TempSrc:   PainSc: 6                  Cecile HearingStephen Edward Garfield Coiner

## 2016-07-29 NOTE — Op Note (Signed)
Preoperative Diagnosis: Grade 1 spondylolisthesis L4-5 with right L4 and L5 radiculopathy and instability with severe spinal stenosis that level.  Postoperative diagnosis: Same  Procedure: #1 Gill decompressive laminectomy L4-L5 with complete medial facetectomies and radical foraminotomies the L4 and L5 nerve roots  #2 posterior lumbar interbody fusion L4-5 using the globus peek cages packed with locally harvested autograft mixed with DBX mix and BMP  #3 cortical screw fixation using the globus Creole cortical screw set with 30 mm 60/50 cortical screws placed at L4 and L5 bilaterally.  #40. Reduction spinal deformity  Surgeon: Jillyn HiddenGary Shelli Portilla  Assistant: Sharlet SalinaBenjamin ditty  Anesthesia: Gen.  EBL: Less than 200  History of present illness: Patient is a 70 year old female whose had long-standing back and right leg pain rating down L4 and L5 nerve root pattern. Workup revealed severe spinal stenosis grade 1 spondylolisthesis and marked foraminal stenosis of the L4 and L5 nerve roots with instability and diastases of the facets. Due to patient's failure conservative treatment imaging findings and progression of clinical syndrome I recommended decompression and fusion L4-5. I extensively went over the risks and benefits of the operation with her as well as perioperative course expectations of outcome and alternatives of surgery and she understood and agreed to proceed forward.  Operative procedure: Patient brought into the or was induced on general acetaminophen prone the [facility] her back was prepped and draped in routine sterile fashion preoperative x-ray localize the appropriate level so after infiltration of 10 mL lidocaine with epi a midline incision was made and Bovie electrocautery was used to carry out subperiosteal dissection care lamina of L4 and L5 exposing the entry points for the pedicles at L4 and L5 bilaterally. Then using AP and lateral fluoroscopy pilot holes were drilled of the 5 and  7:00 positions bilaterally hand drill was used to cannulate the pedicle this was then probed tapped with a 40 And 50 screws were placed 30 mm long. Then central decompression was begun removal of spinous process of L4 was carried out centered decompression was carried out there was marked hourglass compression of thecal sac complete medial facetectomies were performed then I drilled the undersurface of the medial inferior pedicle and unroofed with a 2 mm Kerrison punch identify the L4 nerve root and decompress the L4 foramen bilaterally on the right there was marked spondylitic spurs coming off the inferior facet joint causing severe compression of the distal L4 nerve root. This was all removed decompressing it. At the disc broach was no further stenosis on either L4 or L5 nerve root bilaterally. Disc space is then cleaned out and using sequential distraction I worked up 7-10 and then elected on placing an 11 mm lordotic peek cage then packed with locally harvested autograft mixed with DBX mix and then cleaned out the disc contralateral side packed BMP and autograft mix 7 centrally and laterally inserted the contralateral cage with the interbody work the significantly reduce the deformity fluoroscopy was used the step along the way. Then cortical screws placed at L5 bilaterally in a similar fashion. AP and lateral fluoroscopy confirmed good position of all the implants. Then wound scope was irrigated fixing space was maintained. Exparell was injected in the fascia vancomycin powder was pedicle and the wound 40 mm rods were selected anchored and tightened in place the foraminal reinspected to confirm patency then a large abductor was placed and the wounds closed in layers with after Vicryl additional vancomycin powder were spent: Extrafascial space and the skin was closed with  a running 4 subcuticular Dermabond benzo and Steri-Strips and sterile dressing was applied patient recovered in stable condition. At the end  the case all needle counts sponge counts were correct.

## 2016-07-29 NOTE — Transfer of Care (Signed)
Immediate Anesthesia Transfer of Care Note  Patient: Cheryl Carey  Procedure(s) Performed: Procedure(s): Posterior Laterial Interbody Fusion - Lumbar four-Lumbar five (N/A)  Patient Location: PACU  Anesthesia Type:General  Level of Consciousness: sedated  Airway & Oxygen Therapy: Patient Spontanous Breathing and Patient connected to nasal cannula oxygen  Post-op Assessment: Report given to RN, Post -op Vital signs reviewed and stable and Patient moving all extremities X 4  Post vital signs: Reviewed and stable  Last Vitals:  Vitals:   07/29/16 0629 07/29/16 1132  BP: (!) 173/79   Pulse: 75   Resp: 20   Temp: 36.8 C 37.1 C    Last Pain:  Vitals:   07/29/16 0645  TempSrc:   PainSc: 10-Worst pain ever      Patients Stated Pain Goal: 2 (07/29/16 0645)  Complications: No apparent anesthesia complications

## 2016-07-30 LAB — GLUCOSE, CAPILLARY
GLUCOSE-CAPILLARY: 248 mg/dL — AB (ref 65–99)
GLUCOSE-CAPILLARY: 180 mg/dL — AB (ref 65–99)
GLUCOSE-CAPILLARY: 308 mg/dL — AB (ref 65–99)
Glucose-Capillary: 250 mg/dL — ABNORMAL HIGH (ref 65–99)
Glucose-Capillary: 239 mg/dL — ABNORMAL HIGH (ref 65–99)
Glucose-Capillary: 169 mg/dL — ABNORMAL HIGH (ref 65–99)

## 2016-07-30 MED ORDER — INSULIN ASPART 100 UNIT/ML ~~LOC~~ SOLN
6.0000 [IU] | Freq: Every day | SUBCUTANEOUS | Status: DC
  Administered 2016-07-31 – 2016-08-02 (×3): 6 [IU] via SUBCUTANEOUS

## 2016-07-30 MED ORDER — LISINOPRIL 20 MG PO TABS
40.0000 mg | ORAL_TABLET | Freq: Every day | ORAL | Status: DC
  Administered 2016-07-30 – 2016-08-02 (×2): 40 mg via ORAL
  Filled 2016-07-30 (×4): qty 2

## 2016-07-30 MED ORDER — INSULIN DEGLUDEC 100 UNIT/ML ~~LOC~~ SOPN: 20.0000 [IU] | PEN_INJECTOR | Freq: Every day | SUBCUTANEOUS | Status: DC

## 2016-07-30 MED ORDER — MAGNESIUM OXIDE 400 (241.3 MG) MG PO TABS
200.0000 mg | ORAL_TABLET | Freq: Every day | ORAL | Status: DC
  Filled 2016-07-30 (×4): qty 1

## 2016-07-30 MED ORDER — INSULIN ASPART 100 UNIT/ML ~~LOC~~ SOLN
2.0000 [IU] | Freq: Once | SUBCUTANEOUS | Status: AC
  Administered 2016-07-30: 2 [IU] via SUBCUTANEOUS

## 2016-07-30 MED ORDER — INSULIN DEGLUDEC 200 UNIT/ML ~~LOC~~ SOPN
20.0000 [IU] | PEN_INJECTOR | Freq: Every day | SUBCUTANEOUS | Status: DC
  Filled 2016-07-30: qty 3

## 2016-07-30 MED ORDER — INSULIN ASPART 100 UNIT/ML ~~LOC~~ SOLN
2.0000 [IU] | Freq: Every day | SUBCUTANEOUS | Status: DC
  Administered 2016-07-30 – 2016-08-02 (×2): 2 [IU] via SUBCUTANEOUS

## 2016-07-30 MED ORDER — INSULIN ASPART 100 UNIT/ML ~~LOC~~ SOLN
4.0000 [IU] | Freq: Every day | SUBCUTANEOUS | Status: DC
  Administered 2016-07-30 – 2016-08-01 (×3): 4 [IU] via SUBCUTANEOUS

## 2016-07-30 MED ORDER — INSULIN DEGLUDEC 200 UNIT/ML ~~LOC~~ SOPN
20.0000 [IU] | PEN_INJECTOR | Freq: Every day | SUBCUTANEOUS | Status: DC
  Administered 2016-07-30 – 2016-08-02 (×4): 20 [IU] via SUBCUTANEOUS

## 2016-07-30 NOTE — Progress Notes (Signed)
Patient ID: Cheryl BaldingJoyce A Bayley, female   DOB: 06-22-1946, 70 y.o.   MRN: [id number] Patient doing well pain seems well-controlled  Strength 5 out of 5 wound clean dry and intact  Mobilized today with physical occupational therapy we'll let her start taking her home medications for her diabetes and diabetic nurse to help.

## 2016-07-30 NOTE — Progress Notes (Signed)
Patient is concerned about her blood sugar dropping, felt shaky. Checked cbg 250. Now is concerned about it being too high. Paged Dr. Bevely Palmeritty for extra dose of insulin, received order for 2units of aspart insulin. Will recheck blood sugar in 1 hour. MD will address patient's home insulin regimen in AM.

## 2016-07-30 NOTE — Evaluation (Signed)
Occupational Therapy Evaluation Patient Details Name: Cheryl Carey MRN: [id number] DOB: Aug 24, 1946 Today's Date: 07/30/2016    History of Present Illness 70 yo female s/p  PLIF L 4-L5   Past Medical History:  Diagnosis Date  . Anemia    hx  . Arthritis   . Diabetes mellitus without complication (HCC)   . Headache   . Hypertension   . PONV (postoperative nausea and vomiting)       Clinical Impression   Patient is s/p PLIF L4-5 surgery resulting in functional limitations due to the deficits listed below (see OT problem list). 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  Patient will benefit from skilled OT acutely to increase independence and safety with ADLS to allow discharge SNF.     Follow Up Recommendations  SNF    Equipment Recommendations  Other (comment) (defer SNF)    Recommendations for Other Services       Precautions / Restrictions Precautions Precautions: Back Precaution Comments: back handout provided and reviwed for adls Required Braces or Orthoses: Spinal Brace Spinal Brace: Lumbar corset;Applied in sitting position Restrictions Weight Bearing Restrictions: No      Mobility Bed Mobility Overal bed mobility: Needs Assistance;+2 for physical assistance Bed Mobility: Rolling;Supine to Sit Rolling: Max assist;+2 for physical assistance   Supine to sit: Max assist;+2 for physical assistance;HOB elevated     General bed mobility comments: pt holding bed rail and needed cues to release bed rail to progress to static sitting   Transfers Overall transfer level: Needs assistance Equipment used: Rolling walker (2 wheeled) Transfers: Sit to/from [employer] CorporationStand;Stand Pivot Transfers Sit to Stand: +2 physical assistance;Mod assist;From elevated surface Stand pivot transfers: +2 safety/equipment;Mod assist       General transfer comment: cues for hand placement and sequence    Balance  Overall balance assessment: Needs assistance Sitting-balance support: Bilateral upper extremity supported;Feet supported Sitting balance-Leahy Scale: Poor     Standing balance support: Bilateral upper extremity supported;During functional activity Standing balance-Leahy Scale: Zero                              ADL Overall ADL's : Needs assistance/impaired     Grooming: Wash/dry hands;Wash/dry face;Oral care;Set up;Sitting Grooming Details (indicate cue type and reason): requries seated position due to inability to static stand with balance  Upper Body Bathing: Moderate assistance   Lower Body Bathing: Maximal assistance           Toilet Transfer: +2 for physical assistance;Moderate assistance;Stand-pivot;RW Toilet Transfer Details (indicate cue type and reason): simulated EOB to chair          Functional mobility during ADLs: +2 for physical assistance;Moderate assistance;Rolling walker General ADL Comments: pt needs cues for safety with RW and hand placement. pt very cautious and stopping during transfers stating "wait a minute" Pt requires education prior to movmeent and during movement for patient safety and staff safety.      Vision     Perception     Praxis      Pertinent Vitals/Pain Pain Assessment: 0-10 Pain Score: 7  Pain Location: back  Pain Descriptors / Indicators: Operative site guarding Pain Intervention(s): Premedicated before session;Repositioned;Monitored during session     Hand Dominance Right   Extremity/Trunk Assessment Upper Extremity Assessment Upper Extremity Assessment: Overall WFL for tasks assessed   Lower Extremity Assessment Lower Extremity Assessment: Defer to PT evaluation  Cervical / Trunk Assessment Cervical / Trunk Assessment: Other exceptions (s/p surg )   Communication Communication Communication: No difficulties   Cognition Arousal/Alertness: Awake/alert Behavior During Therapy: WFL for tasks  assessed/performed Overall Cognitive Status: Within Functional Limits for tasks assessed                     General Comments       Exercises       Shoulder Instructions      Home Living Family/patient expects to be discharged to:: Skilled nursing facility                                 Additional Comments: [facility]      Prior Functioning/Environment Level of Independence: Independent        Comments: no DME     OT Diagnosis: Generalized weakness;Acute pain   OT Problem List: Decreased strength;Decreased activity tolerance;Impaired balance (sitting and/or standing);Decreased safety awareness;Decreased knowledge of use of DME or AE;Decreased knowledge of precautions;Pain   OT Treatment/Interventions: Self-care/ADL training;Therapeutic exercise;DME and/or AE instruction;Therapeutic activities;Patient/family education;Balance training    OT Goals(Current goals can be found in the care plan section) Acute Rehab OT Goals Patient Stated Goal: to get back to doing  OT Goal Formulation: With patient Time For Goal Achievement: 08/13/16 Potential to Achieve Goals: Good ADL Goals Pt Will Perform Grooming: with supervision;standing Pt Will Perform Upper Body Bathing: with supervision;sitting Pt Will Transfer to Toilet: with supervision;bedside commode;ambulating Additional ADL Goal #1: Pt will complete bed mobility supervision level with bed rail   OT Frequency: Min 2X/week   Barriers to D/C:            Co-evaluation              End of Session Equipment Utilized During Treatment: Gait belt;Back brace Nurse Communication: Mobility status;Precautions  Activity Tolerance: Patient tolerated treatment well Patient left: in chair;with call bell/phone within reach;with chair alarm set   Time: 1012-    Charges:  OT General Charges $OT Visit: 1 Procedure OT Evaluation $OT Eval Moderate Complexity: 1  Procedure G-Codes:    [name], [name] 07/30/2016, 11:25 AM  [name], [name]   OTR/L Pager: [phone]: [phone] Office: [phone] .

## 2016-07-30 NOTE — Evaluation (Signed)
Physical Therapy Evaluation Patient Details Name: Cheryl BaldingJoyce A Virgin MRN: [id number] DOB: Jan 21, 1946 Today's Date: 07/30/2016   History of Present Illness  70 yo female s/p  PLIF L 4-L5   Clinical Impression  Patient presents with decreased independence with mobility due to deficits listed in PT problem list.  She will benefit from skilled PT in the acute setting to allow return home alone following SNF level rehab stay.    Follow Up Recommendations SNF ([facility])    Equipment Recommendations  Rolling walker with 5" wheels (will need youth RW)    Recommendations for Other Services       Precautions / Restrictions Precautions Precautions: Back;Fall Precaution Comments: back handout provided and reviwed for adls Required Braces or Orthoses: Spinal Brace Spinal Brace: Lumbar corset;Applied in sitting position Restrictions Weight Bearing Restrictions: No      Mobility  Bed Mobility Overal bed mobility: Needs Assistance;+2 for physical assistance Bed Mobility: Rolling;Supine to Sit Rolling: Max assist;+2 for physical assistance   Supine to sit: Max assist;+2 for physical assistance;HOB elevated     General bed mobility comments: Patient up in chair  Transfers Overall transfer level: Needs assistance Equipment used: Standard walker Transfers: Sit to/from Stand Sit to Stand: Mod assist Stand pivot transfers: +2 safety/equipment;Mod assist       General transfer comment: cues and increased time to scoot to EOB and to push up from chair, lifting assist and increased time to transition to walker  Ambulation/Gait Ambulation/Gait assistance: Min assist Ambulation Distance (Feet): 40 Feet Assistive device: Rolling walker (2 wheeled) Gait Pattern/deviations: Step-to pattern;Step-through pattern;Decreased stride length;Shuffle;Wide base of support     General Gait Details: initially very small steps, but able to lengthen some with  increased time/practice, very slow and min A for balance/safety  Stairs            Wheelchair Mobility    Modified Rankin (Stroke Patients Only)       Balance Overall balance assessment: Needs assistance Sitting-balance support: Bilateral upper extremity supported Sitting balance-Leahy Scale: Poor Sitting balance - Comments: UE support in sitting at edge of chair   Standing balance support: Bilateral upper extremity supported Standing balance-Leahy Scale: Poor Standing balance comment: UE support needed for balance                             Pertinent Vitals/Pain Pain Assessment: 0-10 Pain Score: 7  Pain Location: back Pain Descriptors / Indicators: Discomfort;Operative site guarding Pain Intervention(s): Monitored during session;Repositioned    Home Living Family/patient expects to be discharged to:: Skilled nursing facility Living Arrangements: Alone               Additional Comments: [facility]    Prior Function Level of Independence: Independent         Comments: states has a shower seat already     Hand Dominance   Dominant Hand: Right    Extremity/Trunk Assessment   Upper Extremity Assessment: Defer to OT evaluation           Lower Extremity Assessment: Generalized weakness      Cervical / Trunk Assessment: Other exceptions (s/p surg )  Communication   Communication: No difficulties  Cognition Arousal/Alertness: Awake/alert Behavior During Therapy: WFL for tasks assessed/performed Overall Cognitive Status: Within Functional Limits for tasks assessed  General Comments General comments (skin integrity, edema, etc.): dressing dry and intact    Exercises        Assessment/Plan    PT Assessment Patient needs continued PT services  PT Diagnosis Acute pain;Difficulty walking   PT Problem List Decreased activity tolerance;Decreased  balance;Decreased knowledge of use of DME;Decreased knowledge of precautions;Decreased safety awareness;Decreased mobility;Decreased strength  PT Treatment Interventions DME instruction;Gait training;Functional mobility training;Balance training;Therapeutic exercise;Therapeutic activities;Patient/family education   PT Goals (Current goals can be found in the Care Plan section) Acute Rehab PT Goals Patient Stated Goal: to get back to doing  PT Goal Formulation: With patient Time For Goal Achievement: 08/06/16 Potential to Achieve Goals: Good    Frequency Min 5X/week   Barriers to discharge        Co-evaluation               End of Session Equipment Utilized During Treatment: Back brace Activity Tolerance: Patient limited by pain Patient left: in chair;with call bell/phone within reach           Time: 1050-1116 PT Time Calculation (min) (ACUTE ONLY): 26 min   Charges:   PT Evaluation $PT Eval Moderate Complexity: 1 Procedure PT Treatments $Gait Training: 8-22 mins   PT G CodesElray Mcgregor:        Laketta Soderberg 07/30/2016, 11:51 AM  Sheran Lawlessyndi Roddie Riegler, PT [phone] 07/30/2016

## 2016-07-30 NOTE — Progress Notes (Addendum)
Inpatient Diabetes Program Recommendations  AACE/ADA: New Consensus Statement on Inpatient Glycemic Control (2015)  Target Ranges:  Prepandial:   less than 140 mg/dL      Peak postprandial:   less than 180 mg/dL (1-2 hours)      Critically ill patients:  140 - 180 mg/dL  Results for Cheryl BaldingRESSLEY, Cheryl Carey (MRN [id number]) as of 07/30/2016 10:22  Ref. Range 07/29/2016 06:34 07/29/2016 08:59 07/29/2016 10:04 07/29/2016 11:04 07/29/2016 11:36 07/29/2016 16:52 07/29/2016 21:40 07/30/2016 02:11 07/30/2016 05:13 07/30/2016 06:58  Glucose-Capillary Latest Ref Range: 65 - 99 mg/dL 409163 (H) 811212 (H) 914223 (H) 208 (H) 183 (H) 250 (H) 239 (H) 250 (H) 248 (H) 239 (H)    Review of Glycemic Control  Diabetes history: DM2 Outpatient Diabetes medications: Tresiba 20 units daily, Humalog 6 units with breakfast, Humalog 2 units in evening with supper, Metformin 1000 mg BID Current orders for Inpatient glycemic control: Novolog 6 units with breakfast, Novolog 4 units with lunch, Novolog 2 units with supper, Metformin 1000 mg BID, Novolog 0-5 units QHS  Inpatient Diabetes Program Recommendations: Insulin - Basal: Evaristo Buryresiba is not on formulary at hospital. Please order Lantus 20 units daily to start now. Correction (SSI): Please use Glycemic Control order set and order Moderate correction scale (0-15 units) TID with meals.  NOTE: Noted consult for diabetes coordinator. Chart reviewed and recommendations made. Will continue to follow while inpatient.  Addendum 07/30/16@14 :14-Went by to talk with patient regarding diabetes and outpatient regimen for diabetes control. However, patient was very drowsy and not able to stay awake for conversation. Talked with RN and she reports that patient recently had medication for pain. RN stated that patient is very particular about her glycemic control and she actually had staff call MD last night when her glucose was elevated. Patient provided her home Tresiba insulin pen and now ordered [place]iba 20  units daily. RN reports that patient has received her Evaristo Buryresiba today and pen will be stored for patient use while hospitalized.  Thanks, Orlando PennerMarie Berdie Malter, RN, MSN, CDE Diabetes Coordinator Inpatient Diabetes Program [phone] (Team Pager from 8am to 5pm) [phone] ([facility] office) [phone] [facility] office) [phone] [facility] office)

## 2016-07-31 LAB — GLUCOSE, CAPILLARY
GLUCOSE-CAPILLARY: 227 mg/dL — AB (ref 65–99)
Glucose-Capillary: 209 mg/dL — ABNORMAL HIGH (ref 65–99)
Glucose-Capillary: 64 mg/dL — ABNORMAL LOW (ref 65–99)
Glucose-Capillary: 66 mg/dL (ref 65–99)
Glucose-Capillary: 311 mg/dL — ABNORMAL HIGH (ref 65–99)

## 2016-07-31 MED ORDER — OXYCODONE-ACETAMINOPHEN 5-325 MG PO TABS
1.0000 | ORAL_TABLET | ORAL | Status: DC | PRN
  Administered 2016-07-31 – 2016-08-02 (×10): 1 via ORAL
  Filled 2016-07-31 (×10): qty 1

## 2016-07-31 MED ORDER — CEFAZOLIN SODIUM-DEXTROSE 2-4 GM/100ML-% IV SOLN
2.0000 g | Freq: Three times a day (TID) | INTRAVENOUS | Status: AC
  Administered 2016-07-31 – 2016-08-02 (×6): 2 g via INTRAVENOUS
  Filled 2016-07-31 (×6): qty 100

## 2016-07-31 NOTE — Care Management Note (Signed)
Case Management Note  Patient Details  Name: Cheryl Carey MRN: [id number] Date of Birth: [birth year]  Subjective/Objective:   Pt is s/p lumbar surgery and is from home alone.                  Action/Plan: PT/OT recommendations are for SNF. Pt has requested [facility]. CM following for discharge needs.   Expected Discharge Date:                  Expected Discharge Plan:  Skilled Nursing Facility  In-House Referral:     Discharge planning Services     Post Acute Care Choice:    Choice offered to:     DME Arranged:    DME Agency:     HH Arranged:    HH Agency:     Status of Service:  In process, will continue to follow  If discussed at Long Length of Stay Meetings, dates discussed:    Additional Comments:  Kermit BaloKelli F Macarthur Lorusso, RN 07/31/2016, 11:20 AM

## 2016-07-31 NOTE — Clinical Social Work Note (Signed)
Clinical Social Work Assessment  Patient Details  Name: Cheryl Carey MRN: [id number] Date of Birth: Oct 13, 1946  Date of referral:  07/31/16               Reason for consult:  Facility Placement                Permission sought to share information with:  Family Supports Permission granted to share information::  Yes, Verbal Permission Granted  Name::     Thibeau,Leonard  Relationship::  brother  Contact Information:  [phone]; [phone]  Housing/Transportation Living arrangements for the past 2 months:  Skilled Nursing Facility Source of Information:  Patient, Other (Comment Required) (brother) Patient Interpreter Needed:  None Criminal Activity/Legal Involvement Pertinent to Current Situation/Hospitalization:  No - Comment as needed Significant Relationships:  Siblings Lives with:  Self Do you feel safe going back to the place where you live?  Yes Need for family participation in patient care:  Yes (Comment)  Care giving concerns:  No care giving concerns identified. Pt's brother has made arrangements for pt to have a care giver at home when she is discharged from the SNF.   Social Worker assessment / plan:  CSW me with pt and brother to address consult for New SNF. CSW introduced herself and explained role of social work. CSW also explained the process of discharging to SNF. [facility]. [facility]. CSW sent referral to facility, who will be able to accept pt at discharge. CSW also discussed possible course of treatment at SNF as well as anticipated discharge planning. CSW contacted facility's admissions coordinator to address needs while at facility and needs when pt to return home. CSW provided supportive counseling. CSW will continue to follow.   Employment status:  Retired Database administratornsurance information:  Managed Medicare PT Recommendations:   Skilled Nursing Facility Information / Referral to  community resources:  Skilled Nursing Facility  Patient/Family's Response to care:  Pt and brother appreciative of CSW support.  Patient/Family's Understanding of and Emotional Response to Diagnosis, Current Treatment, and Prognosis:  Pt is aware that she would benefit from STR at SNF prior to returning home.   Emotional Assessment Appearance:  Appears stated age Attitude/Demeanor/Rapport:   Appropriate Affect (typically observed):  Accepting, Pleasant Orientation:  Oriented to Self, Oriented to Place, Oriented to  Time, Oriented to Situation Alcohol / Substance use:  Never Used Psych involvement (Current and /or in the community):  No (Comment)  Discharge Needs  Concerns to be addressed:  No discharge needs identified Readmission within the last 30 days:  No Current discharge risk:  None Barriers to Discharge:  No Barriers Identified   Dede QuerySarah Thelia Tanksley, LCSW 07/31/2016, 5:03 PM

## 2016-07-31 NOTE — Clinical Social Work Placement (Signed)
   CLINICAL SOCIAL WORK PLACEMENT  NOTE  Date:  07/31/2016  Patient Details  Name: Cheryl Carey MRN: [id number] Date of Birth: 1946/10/28  Clinical Social Work is seeking post-discharge placement for this patient at the Skilled  Nursing Facility level of care (*CSW will initial, date and re-position this form in  chart as items are completed):  Yes   Patient/family provided with [facility] Clinical Social Work Department's list of facilities offering this level of care within the geographic area requested by the patient (or if unable, by the patient's family).  Yes   Patient/family informed of their freedom to choose among providers that offer the needed level of care, that participate in Medicare, Medicaid or managed care program needed by the patient, have an available bed and are willing to accept the patient.  Yes   Patient/family informed of [facility]'s ownership interest in [facility] and [facility], as well as of the fact that they are under no obligation to receive care at these facilities.  PASRR submitted to EDS on 07/31/16     PASRR number received on 07/31/16     Existing PASRR number confirmed on       FL2 transmitted to all facilities in geographic area requested by pt/family on 07/31/16     FL2 transmitted to all facilities within larger geographic area on       Patient informed that his/her managed care company has contracts with or will negotiate with certain facilities, including the following:        Yes   Patient/family informed of bed offers received.  [facility]     Physician recommends and patient chooses bed at      Patient to be transferred to   on  .  Patient to be transferred to facility by       Patient family notified on   of transfer.  Name of family member notified:        PHYSICIAN       Additional Comment:    _______________________________________________ Dede QuerySarah Jaquann Guarisco, LCSW 07/31/2016, 4:55  PM

## 2016-07-31 NOTE — Progress Notes (Signed)
Physical Therapy Treatment Patient Details Name: Garald BaldingJoyce A Haman MRN: [id number] DOB: 01-29-1946 Today's Date: 07/31/2016    History of Present Illness 70 yo female s/p  PLIF L 4-L5     PT Comments    Patient progressing with ambulation quality and tolerance this session.  Still needs mod A for sit to stand and bed mobility.  Continue to recommend SNF rehab prior to d/c home.   Follow Up Recommendations  SNF     Equipment Recommendations  Rolling walker with 5" wheels (youth RW)    Recommendations for Other Services       Precautions / Restrictions Precautions Precautions: Back;Fall Required Braces or Orthoses: Spinal Brace Spinal Brace: Lumbar corset;Applied in sitting position Restrictions Weight Bearing Restrictions: No    Mobility  Bed Mobility Overal bed mobility: Needs Assistance Bed Mobility: Rolling;Sidelying to Sit;Sit to Sidelying Rolling: Supervision Sidelying to sit: Min assist     Sit to sidelying: Mod assist General bed mobility comments: assist for legs into chair with sit to side and mod cues for technique, assist for lifting trunk to sit  Transfers Overall transfer level: Needs assistance Equipment used: Rolling walker (2 wheeled)   Sit to Stand: Mod assist         General transfer comment: increased time to scoot to edge of chair and assist to lift up from chair with increased time and pain  Ambulation/Gait Ambulation/Gait assistance: Min assist Ambulation Distance (Feet): 80 Feet Assistive device: Rolling walker (2 wheeled) Gait Pattern/deviations: Step-through pattern;Decreased stride length     General Gait Details: taking longer steps this session, but limited by pain, though some improvement in stiffness with movement   Stairs            Wheelchair Mobility    Modified Rankin (Stroke Patients Only)       Balance Overall balance assessment: Needs assistance   Sitting balance-Leahy Scale: Fair Sitting balance -  Comments: able to sit at edge of bed no UE support   Standing balance support: Bilateral upper extremity supported Standing balance-Leahy Scale: Poor Standing balance comment: UE for balance                    Cognition Arousal/Alertness: Awake/alert Behavior During Therapy: WFL for tasks assessed/performed Overall Cognitive Status: Within Functional Limits for tasks assessed                      Exercises      General Comments General comments (skin integrity, edema, etc.): Noted IV leaking, RN made aware, also emptyed drain due to fullness and RN made aware.  Educated brother in room about progression and plan due to questions.       Pertinent Vitals/Pain Pain Score: 7  Pain Location: back at incision Pain Descriptors / Indicators: Discomfort;Sore Pain Intervention(s): Monitored during session;Repositioned    Home Living                      Prior Function            PT Goals (current goals can now be found in the care plan section) Progress towards PT goals: Progressing toward goals    Frequency  Min 5X/week    PT Plan Current plan remains appropriate    Co-evaluation             End of Session Equipment Utilized During Treatment: Back brace;Gait belt Activity Tolerance: Patient tolerated treatment well Patient left: with call  bell/phone within reach;with family/visitor present;in chair     Time: 0920-1003 PT Time Calculation (min) (ACUTE ONLY): 43 min  Charges:  $Gait Training: 8-22 mins $Therapeutic Activity: 23-37 mins                    G Codes:      Elray McgregorCynthia Greenley Martone 07/31/2016, 11:37 AM  Sheran Lawlessyndi Nashly Olsson, PT [phone] 07/31/2016

## 2016-07-31 NOTE — Progress Notes (Signed)
Patient complaining of pain 8 out of 10, has only tylenol for pain and patient does not want that. MD's office called to notify. Awaiting a call back

## 2016-07-31 NOTE — Progress Notes (Signed)
Inpatient Diabetes Program Recommendations  AACE/ADA: New Consensus Statement on Inpatient Glycemic Control (2015)  Target Ranges:  Prepandial:   less than 140 mg/dL      Peak postprandial:   less than 180 mg/dL (1-2 hours)      Critically ill patients:  140 - 180 mg/dL   Lab Results  Component Value Date   GLUCAP 227 (H) 07/31/2016   HGBA1C 9.7 (H) 06/17/2014    Review of Glycemic Control:  Results for Garald BaldingRESSLEY, Cheryl A (MRN [id number]) as of 07/31/2016 11:11  Ref. Range 07/30/2016 06:58 07/30/2016 11:49 07/30/2016 16:10 07/30/2016 21:04 07/31/2016 06:07  Glucose-Capillary Latest Ref Range: 65 - 99 mg/dL 409239 (H) 811169 (H) 914180 (H) 308 (H) 227 (H)    Please add Novolog sensitive correction tid with meals and HS.    Thanks, Cheryl MeagerJenny Carmelina Balducci, RN, BC-ADM Inpatient Diabetes Coordinator Pager [phone] (8a-5p)

## 2016-07-31 NOTE — Progress Notes (Addendum)
Spoke with patient regarding glycemic control.  Patient states that she did not get her Evaristo Buryresiba yesterday due to it not being available, however it is documented as given.  Discussed the role of stress with increased blood sugars.  Discussed potential of adding Novolog correction to current Novolog order.  She states that she does not have a big appetitie but is eating to prevent low blood sugars.   MD,  May consider adding Novolog correction starting at 151 mg/dL.  Consider "custom scale" of Novolog:                     151-200 mg/dL-1 unit                    201-250 mg/dL-2 units                    251-300 mg/dL-3 units                    301-350 mg/dL-4 units                    351-400 mg/dL- 5 units, >161>401 mg/dL- call MD- tid with meals.  Patient see's endocrinologist, Dr. Talmage NapBalan.  She states that her last A1C was 7.4% and that she has seen improvement in blood sugars since starting Tresiba.  Thanks, Beryl MeagerJenny Darryle Dennie, RN, BC-ADM Inpatient Diabetes Coordinator Pager [phone]

## 2016-07-31 NOTE — [place] FL2 (Signed)
[place] MEDICAID FL2 LEVEL OF CARE SCREENING TOOL     IDENTIFICATION  Patient Name: Cheryl Carey Birthdate: 05/11/1946 Sex: female Admission Date (Current Location): 07/29/2016  [facility] [place]   Facility and Address:  [facility] [place], [address], [place], [place] [address]      Provider Number: [id number]  Attending Physician Name and Address:  Donalee CitrinGary Cram, MD  Relative Name and Phone Number:       Current Level of Care: Hospital Recommended Level of Care: Skilled Nursing Facility Prior Approval Number:    Date Approved/Denied:   PASRR Number: [id number]  Discharge Plan: SNF    Current Diagnoses: Patient Active Problem List   Diagnosis Date Noted  . Spondylolisthesis at L4-L5 level 07/29/2016  . DKA (diabetic ketoacidoses) (HCC) 06/16/2014  . Hypertension 06/16/2014    Orientation RESPIRATION BLADDER Height & Weight     Self, Time, Situation, Place  Normal Continent Weight: 181 lb (82.1 kg) Height:  5\' 1"  (154.9 cm)  BEHAVIORAL SYMPTOMS/MOOD NEUROLOGICAL BOWEL NUTRITION STATUS      Continent Diet (Carb Modified, Thin Liquids)  AMBULATORY STATUS COMMUNICATION OF NEEDS Skin   Limited Assist Verbally Surgical wounds                       Personal Care Assistance Level of Assistance  Bathing, Dressing, Feeding Bathing Assistance: Limited assistance Feeding assistance: Independent Dressing Assistance: Limited assistance     Functional Limitations Info  Sight, Hearing, Speech Sight Info: Adequate Hearing Info: Adequate Speech Info: Adequate    SPECIAL CARE FACTORS FREQUENCY  PT (By licensed PT), OT (By licensed OT)     PT Frequency: 5 OT Frequency: 5            Contractures Contractures Info: Not present    Additional Factors Info  Code Status, Allergies, Insulin Sliding Scale Code Status Info: Full Code Allergies Info: Codeine   Insulin Sliding Scale Info: 4x/day       Current  Medications (07/31/2016):  This is the current hospital active medication list Current Facility-Administered Medications  Medication Dose Route Frequency Provider Last Rate Last Dose  . 0.45 % NaCl with KCl 20 mEq / L infusion   Intravenous Continuous Donalee CitrinGary Cram, MD 75 mL/hr at 07/29/16 1449    . 0.9 %  sodium chloride infusion  250 mL Intravenous Continuous Donalee CitrinGary Cram, MD      . acetaminophen (TYLENOL) tablet 650 mg  650 mg Oral Q4H PRN Donalee CitrinGary Cram, MD       Or  . acetaminophen (TYLENOL) suppository 650 mg  650 mg Rectal Q4H PRN Donalee CitrinGary Cram, MD      . amLODipine (NORVASC) tablet 10 mg  10 mg Oral Daily Donalee CitrinGary Cram, MD   10 mg at 07/30/16 0926  . aspirin EC tablet 81 mg  81 mg Oral Daily Donalee CitrinGary Cram, MD   81 mg at 07/31/16 1003  . bisacodyl (DULCOLAX) EC tablet 5 mg  5 mg Oral Daily PRN Donalee CitrinGary Cram, MD      . bupivacaine liposome (EXPAREL) 1.3 % injection 266 mg  20 mL Infiltration Once Donalee CitrinGary Cram, MD      . ceFAZolin (ANCEF) IVPB 2g/100 mL premix  2 g Intravenous Q8H Donalee CitrinGary Cram, MD      . cyclobenzaprine (FLEXERIL) tablet 10 mg  10 mg Oral TID PRN Donalee CitrinGary Cram, MD   10 mg at 07/31/16 0223  . insulin aspart (novoLOG) injection 0-5  Units  0-5 Units Subcutaneous QHS Donalee CitrinGary Cram, MD   4 Units at 07/30/16 2214  . insulin aspart (novoLOG) injection 2 Units  2 Units Subcutaneous Q supper Donalee CitrinGary Cram, MD   2 Units at 07/30/16 1634  . insulin aspart (novoLOG) injection 4 Units  4 Units Subcutaneous Q lunch Donalee CitrinGary Cram, MD   4 Units at 07/30/16 1222  . insulin aspart (novoLOG) injection 6 Units  6 Units Subcutaneous Q breakfast Donalee CitrinGary Cram, MD   6 Units at 07/31/16 [phone]  . Insulin Degludec SOPN 20 Units  20 Units Subcutaneous Daily Donalee CitrinGary Cram, MD   20 Units at 07/30/16 1030  . lisinopril (PRINIVIL,ZESTRIL) tablet 40 mg  40 mg Oral Daily Donalee CitrinGary Cram, MD   40 mg at 07/30/16 0926  . loratadine (CLARITIN) tablet 10 mg  10 mg Oral Daily PRN Donalee CitrinGary Cram, MD      . magnesium oxide (MAG-OX) tablet 200 mg  200 mg Oral Daily Donalee CitrinGary Cram, MD       . menthol-cetylpyridinium (CEPACOL) lozenge 3 mg  1 lozenge Oral PRN Donalee CitrinGary Cram, MD       Or  . phenol (CHLORASEPTIC) mouth spray 1 spray  1 spray Mouth/Throat PRN Donalee CitrinGary Cram, MD      . metFORMIN (GLUCOPHAGE) tablet 1,000 mg  1,000 mg Oral BID WC Donalee CitrinGary Cram, MD   1,000 mg at 07/31/16 0819  . ondansetron (ZOFRAN) injection 4 mg  4 mg Intravenous Q4H PRN Donalee CitrinGary Cram, MD   4 mg at 07/30/16 0301  . polyvinyl alcohol (LIQUIFILM TEARS) 1.4 % ophthalmic solution 1 drop  1 drop Both Eyes PRN Donalee CitrinGary Cram, MD   1 drop at 07/29/16 1738  . senna-docusate (Senokot-S) tablet 1 tablet  1 tablet Oral QHS PRN Donalee CitrinGary Cram, MD      . sodium chloride flush (NS) 0.9 % injection 3 mL  3 mL Intravenous Q12H Donalee CitrinGary Cram, MD   3 mL at 07/30/16 [id number]  . sodium chloride flush (NS) 0.9 % injection 3 mL  3 mL Intravenous PRN Donalee CitrinGary Cram, MD   3 mL at 07/31/16 1006     Discharge Medications: Please see discharge summary for a list of discharge medications.  Relevant Imaging Results:  Relevant Lab Results:   Additional Information SSN:  [id number]  Dede QuerySarah Krissa Utke, LCSW

## 2016-07-31 NOTE — Progress Notes (Signed)
MD's office called again. Awaiting call back.

## 2016-07-31 NOTE — Progress Notes (Signed)
Patient ID: [name], female   DOB: 31-Dec-1945, 70 y.o.   MRN: [id number] Patient doing okay condition back pain but no leg pain recovering well  Strength out of 5 wound clean dry and intact  Continue to mobilize with physical occupational therapy we'll DC her IV delighted as is making her confused.

## 2016-08-01 ENCOUNTER — Ambulatory Visit (HOSPITAL_COMMUNITY): Payer: Medicare Other

## 2016-08-01 DIAGNOSIS — M79609 Pain in unspecified limb: Secondary | ICD-10-CM

## 2016-08-01 LAB — GLUCOSE, CAPILLARY
GLUCOSE-CAPILLARY: 134 mg/dL — AB (ref 65–99)
GLUCOSE-CAPILLARY: 127 mg/dL — AB (ref 65–99)
Glucose-Capillary: 104 mg/dL — ABNORMAL HIGH (ref 65–99)
Glucose-Capillary: 210 mg/dL — ABNORMAL HIGH (ref 65–99)

## 2016-08-01 MED ORDER — WHITE PETROLATUM GEL
Status: AC
  Administered 2016-08-02: 04:00:00
  Filled 2016-08-01: qty 1

## 2016-08-01 NOTE — Progress Notes (Signed)
Patient ID: Cheryl Carey, female   DOB: 1946-01-31, 70 y.o.   MRN: [id number] Overall patient is doing okay condition of severe back pain she did have some cramping in her left calf overnight and it is somewhat swollen and tender to touch. Strength out of 5 wound clean dry and intact  Check lower extremity venous duplex Doppler to rule out DVT continue to mobilize with physical occupational therapy if the ultrasound is negative patient stable for discharge to rehabilitation whenever bed becomes available.

## 2016-08-01 NOTE — Progress Notes (Signed)
PT Cancellation Note  Patient Details Name: Cheryl Carey MRN: [id number] DOB: 1946-02-09   Cancelled Treatment:    Reason Eval/Treat Not Completed: Patient at procedure or test/unavailable PT will check on pt later as time allows.    Derek MoundKellyn R Randal Goens Sherrilyn Nairn, PTA Pager: [phone]   08/01/2016, 3:31 PM

## 2016-08-01 NOTE — Progress Notes (Signed)
VASCULAR LAB PRELIMINARY  PRELIMINARY  PRELIMINARY  PRELIMINARY  Bilateral lower extremity venous duplex completed.    Preliminary report:  There is no DVT or SVT noted in the bilateral lower extremities.   Quenna Doepke, RVT 08/01/2016, 3:16 PM

## 2016-08-01 NOTE — Progress Notes (Signed)
Inpatient Diabetes Program Recommendations  AACE/ADA: New Consensus Statement on Inpatient Glycemic Control (2015)  Target Ranges:  Prepandial:   less than 140 mg/dL      Peak postprandial:   less than 180 mg/dL (1-2 hours)      Critically ill patients:  140 - 180 mg/dL  Results for Cheryl Carey, Cheryl Carey (MRN [id number]) as of 08/01/2016 08:42  Ref. Range 07/31/2016 06:07 07/31/2016 11:25 07/31/2016 16:20 07/31/2016 16:35 07/31/2016 21:13 08/01/2016 06:00  Glucose-Capillary Latest Ref Range: 65 - 99 mg/dL 409227 (H) 811209 (H) 64 (L) 66 311 (H) 134 (H)    Review of Glycemic Control  Diabetes history: DM2 Outpatient Diabetes medications:Tresiba 20 units daily, Humalog 6 units with breakfast, Humalog 2 units in evening with supper, Metformin 1000 mg BID Current orders for Inpatient glycemic control: Novolog 6 units with breakfast, Novolog 4 units with lunch, Novolog 2 units with supper, Metformin 1000 mg BID, Novolog 0-5 units QHS, Tresiba 20 units daily  Inpatient Diabetes Program Recommendations: Correction: Glucose over the past 24 hours has ranged from 64-311 mg/dl. Please consider ordering CBGs and Novolog custom scale as follows:   "Custom scale" of Novolog:                     151-200 mg/dL-1 unit                    201-250 mg/dL-2 units                    251-300 mg/dL-3 units                    301-350 mg/dL-4 units                    351-400 mg/dL- 5 units, >914>401 mg/dL- call MD- tid with meals  Thanks, Cheryl PennerMarie Erika Slaby, RN, MSN, CDE Diabetes Coordinator Inpatient Diabetes Program [phone] (Team Pager from 8am to 5pm) [phone] ([facility] office) [phone] [facility] office) [phone] [facility] office)

## 2016-08-01 NOTE — Care Management Important Message (Signed)
Important Message  Patient Details  Name: Cheryl Carey MRN: [id number] Date of Birth: 05/12/1946   Medicare Important Message Given:  Yes    [name] Stefan ChurchBratton 08/01/2016, 9:57 AM

## 2016-08-01 NOTE — Progress Notes (Signed)
Physical Therapy Treatment Patient Details Name: Cheryl BaldingJoyce A Berlinger MRN: [id number] DOB: 11-03-1946 Today's Date: 08/01/2016    History of Present Illness 70 yo female s/p  PLIF L 4-L5     PT Comments    Patient is progressing gradually toward mobility goals. Most difficulty with transitional movements. Current plan remains appropriate.   Follow Up Recommendations  SNF     Equipment Recommendations  Rolling walker with 5" wheels    Recommendations for Other Services       Precautions / Restrictions Precautions Precautions: Back;Fall Precaution Comments: reviewed precautions Required Braces or Orthoses: Spinal Brace Spinal Brace: Lumbar corset;Applied in sitting position Restrictions Weight Bearing Restrictions: No    Mobility  Bed Mobility Overal bed mobility: Needs Assistance Bed Mobility: Rolling;Sidelying to Sit Rolling: Min assist Sidelying to sit: Min assist       General bed mobility comments: cues for sequencing/technique and maintaining back precautions; assist at knees when rolling and to elevate trunk into sitting  Transfers Overall transfer level: Needs assistance Equipment used: Rolling walker (2 wheeled) Transfers: Sit to/from Stand Sit to Stand: Min assist         General transfer comment: assist to power up into standing; increased time needed; cues for safe hand placement and technique  Ambulation/Gait Ambulation/Gait assistance: Min guard Ambulation Distance (Feet): 80 Feet Assistive device: Rolling walker (2 wheeled) Gait Pattern/deviations: Step-to pattern;Decreased step length - left;Decreased stride length     General Gait Details: very slow, steady gait; cues for posture, step symmetry/length, and bilat heel strike   Stairs            Wheelchair Mobility    Modified Rankin (Stroke Patients Only)       Balance                                    Cognition Arousal/Alertness: Awake/alert Behavior During  Therapy: WFL for tasks assessed/performed Overall Cognitive Status: Within Functional Limits for tasks assessed                      Exercises      General Comments        Pertinent Vitals/Pain Pain Assessment: 0-10 Pain Score: 7  Pain Location: back Pain Descriptors / Indicators: Aching;Sore Pain Intervention(s): Limited activity within patient's tolerance;Monitored during session;Premedicated before session;Repositioned    Home Living Family/patient expects to be discharged to:: Skilled nursing facility Living Arrangements: Alone                  Prior Function            PT Goals (current goals can now be found in the care plan section) Acute Rehab PT Goals Patient Stated Goal: get better Progress towards PT goals: Progressing toward goals    Frequency  Min 5X/week    PT Plan Current plan remains appropriate    Co-evaluation             End of Session Equipment Utilized During Treatment: Back brace;Gait belt Activity Tolerance: Patient tolerated treatment well Patient left: in chair;with call bell/phone within reach;with family/visitor present     Time: 1324-40101625-1711 PT Time Calculation (min) (ACUTE ONLY): 46 min  Charges:  $Gait Training: 23-37 mins $Therapeutic Activity: 8-22 mins                    G Codes:  Derek MoundKellyn R Pearl Bents Doriana Mazurkiewicz, PTA Pager: [phone]   08/01/2016, 5:18 PM

## 2016-08-02 LAB — GLUCOSE, CAPILLARY
GLUCOSE-CAPILLARY: 143 mg/dL — AB (ref 65–99)
Glucose-Capillary: 132 mg/dL — ABNORMAL HIGH (ref 65–99)
Glucose-Capillary: 91 mg/dL (ref 65–99)

## 2016-08-02 MED ORDER — OXYCODONE-ACETAMINOPHEN 5-325 MG PO TABS: 1.0000 | ORAL_TABLET | ORAL | 0 refills | Status: AC | PRN

## 2016-08-02 NOTE — Progress Notes (Signed)
Patient ID: Garald BaldingJoyce A Donnan, female   DOB: Dec 26, 1945, 70 y.o.   MRN: [id number]

## 2016-08-02 NOTE — Clinical Social Work Placement (Signed)
   CLINICAL SOCIAL WORK PLACEMENT  NOTE  Date:  08/02/2016  Patient Details  Name: Cheryl Carey MRN: [id number] Date of Birth: 12-27-1945  Clinical Social Work is seeking post-discharge placement for this patient at the Skilled  Nursing Facility level of care (*CSW will initial, date and re-position this form in  chart as items are completed):  Yes   Patient/family provided with Olivia Lopez de Gutierrez Clinical Social Work Department's list of facilities offering this level of care within the geographic area requested by the patient (or if unable, by the patient's family).  Yes   Patient/family informed of their freedom to choose among providers that offer the needed level of care, that participate in Medicare, Medicaid or managed care program needed by the patient, have an available bed and are willing to accept the patient.  Yes   Patient/family informed of [facility]'s ownership interest in [facility] and [facility], as well as of the fact that they are under no obligation to receive care at these facilities.  PASRR submitted to EDS on 07/31/16     PASRR number received on 07/31/16     Existing PASRR number confirmed on       FL2 transmitted to all facilities in geographic area requested by pt/family on 07/31/16     FL2 transmitted to all facilities within larger geographic area on       Patient informed that his/her managed care company has contracts with or will negotiate with certain facilities, including the following:        Yes   Patient/family informed of bed offers received.  [facility]     Physician recommends and patient chooses bed at      [facility].  Patient to be transferred to facility by [facility]     Patient family notified on 08/02/16 of transfer.  Name of family member notified:  Mr. Donald Poreressley, pt's brother     PHYSICIAN       Additional Comment:     _______________________________________________ Dede QuerySarah Temisha Murley, LCSW 08/02/2016, 2:47 PM

## 2016-08-02 NOTE — Progress Notes (Signed)
Patient's brother wanted to know when she will be d/c. Writer called MD's office to notify. And Diplomatic Services operational officersecretary told Clinical research associatewriter that MD is in surgery and she has given him the message. Patient's brother was made aware.

## 2016-08-02 NOTE — Clinical Social Work Note (Signed)
RN Report Information  [facility] via [facility] Report [phone] Room [facility]   Pt is ready for discharge today and [facility]. Facility has received discharge information and is ready to admit pt. Pt is aware and agreeable to discharge plan. CSW left a message for pt's brother requesting a return phone call. RN will call report. [facility] will provide transportation. CSW is signing off as no further needs identified.   Dede QuerySarah Nuriyah Hanline, MSW, LCSW  Clinical Social Worker  [phone]

## 2016-08-02 NOTE — Progress Notes (Signed)
Report given to the receiving nurse, Archie [facility]onya [facility].

## 2016-08-02 NOTE — Discharge Instructions (Signed)
No lifting no bending no twisting °

## 2016-08-02 NOTE — Progress Notes (Signed)
Physical Therapy Treatment Patient Details Name: Cheryl Carey MRN: [id number] DOB: [birth year] Today's Date: 08/02/2016    History of Present Illness 70 yo female s/p  PLIF L 4-L5 , PMH of DM and HTN.    PT Comments    Progressing slowly.  Emphasis on tranfers, gait and educations  Follow Up Recommendations  SNF     Equipment Recommendations  Rolling walker with 5" wheels    Recommendations for Other Services       Precautions / Restrictions Precautions Precautions: Back;Fall Precaution Comments: reviewed precautions Required Braces or Orthoses: Spinal Brace Spinal Brace: Lumbar corset;Applied in sitting position    Mobility  Bed Mobility                  Transfers Overall transfer level: Needs assistance Equipment used: Rolling walker (2 wheeled) Transfers: Sit to/from Stand Sit to Stand: Min assist         General transfer comment: assist to power up into standing; increased time needed; cues for safe hand placement and technique  Ambulation/Gait Ambulation/Gait assistance: Min assist Ambulation Distance (Feet): 90 Feet Assistive device: Rolling walker (2 wheeled) Gait Pattern/deviations: Step-to pattern;Step-through pattern Gait velocity: slow Gait velocity interpretation: Below normal speed for age/gender General Gait Details: difficulty getting her left foot through due to weight-bearing  pain on the right   Stairs            Wheelchair Mobility    Modified Rankin (Stroke Patients Only)       Balance Overall balance assessment: Needs assistance   Sitting balance-Leahy Scale: Fair       Standing balance-Leahy Scale: Poor                      Cognition Arousal/Alertness: Awake/alert Behavior During Therapy: WFL for tasks assessed/performed Overall Cognitive Status: Within Functional Limits for tasks assessed                      Exercises      General Comments General comments (skin integrity, edema,  etc.): Reinforced all education.  Worked on w/shifting and scooting as well as transitions.      Pertinent Vitals/Pain Pain Assessment: Faces Faces Pain Scale: Hurts even more Pain Location: buttocks and hips Pain Descriptors / Indicators: Aching;Discomfort;Grimacing    Home Living                      Prior Function            PT Goals (current goals can now be found in the care plan section) Acute Rehab PT Goals Patient Stated Goal: get better PT Goal Formulation: With patient Time For Goal Achievement: 08/06/16 Potential to Achieve Goals: Good Progress towards PT goals: Progressing toward goals    Frequency  Min 5X/week    PT Plan Current plan remains appropriate    Co-evaluation             End of Session Equipment Utilized During Treatment: Back brace Activity Tolerance: Patient tolerated treatment well Patient left: in chair;with call bell/phone within reach;with family/visitor present     Time: 4098-11911254-1335 PT Time Calculation (min) (ACUTE ONLY): 41 min  Charges:  $Gait Training: 8-22 mins $Therapeutic Activity: 8-22 mins $Self Care/Home Management: 8-22                    G Codes:      Bowyn Mercier, Eliseo GumKenneth V 08/02/2016, 3:04 PM  08/02/2016  Donnella Sham, PT [phone]  (pager)

## 2016-08-02 NOTE — Progress Notes (Signed)
Patient ID: Garald BaldingJoyce A Eaddy, female   DOB: 03-28-1946, 70 y.o.   MRN: [id number] Well condition of back pain no leg pain  Strength out of 5 wound clean dry and intact  [facility]

## 2016-08-02 NOTE — Progress Notes (Signed)
Patient left unit for nursing home. Transported by [facility]

## 2016-08-02 NOTE — Discharge Summary (Signed)
Physician Discharge Summary  Patient ID: Cheryl Carey MRN: [id number] DOB/AGE: [birth year] 70 y.o.  Admit date: 07/29/2016 Discharge date: 08/02/2016  Admission Diagnoses:Lumbar spinal stenosis grade 1 spondylolisthesis L4-5  Discharge Diagnoses: Same Active Problems:   Spondylolisthesis at L4-L5 level   Discharged Condition: good  Hospital Course: Patient is admitted went to the hospital underwent decompressive laminectomy and fusion at L4-5 postoperative patient did fairly well significant improvement preoperative leg pain severe back pain after surgery but he did get progressively better in the days following surgery and by postop day 4 she was stable for [facility] she is ambulating she is voiding spontaneously and tolerating regular diet she'll be discharged on oxycodone for pain she should have follow-up in one to 2 weeks. Also noted during the hospitalization she was complaining of some left calf pain we did check a venous duplex Doppler in there was no evidence of DVT  Consults: Significant Diagnostic Studies: Treatments: Decompressive laminectomy and fusion L4-5 Discharge Exam: Blood pressure (!) 152/59, pulse 84, temperature 99.4 F (37.4 C), temperature source Oral, resp. rate 18, height 5\' 1"  (1.549 m), weight 82.1 kg (181 lb), SpO2 97 %. Strength out of 5 wound clean dry and intact  Disposition: [facility]     Medication List    TAKE these medications   amLODipine 10 MG tablet Commonly known as:  NORVASC Take 10 mg by mouth daily.   aspirin 81 MG tablet Take 81 mg by mouth daily.   FLAXSEED OIL PO Take 1,400 mg by mouth daily.   ibuprofen 600 MG tablet Commonly known as:  ADVIL,MOTRIN Take 1 tablet (600 mg total) by mouth every 8 (eight) hours as needed. What changed:  reasons to take this   insulin lispro 100 UNIT/ML injection Commonly known as:  HUMALOG Inject 2-6 Units into the skin See admin instructions. 6 units before breakfast,  2 units in evening   loratadine 10 MG tablet Commonly known as:  CLARITIN Take 10 mg by mouth daily as needed for allergies.   magnesium 30 MG tablet Take 30 mg by mouth daily.   metFORMIN 500 MG tablet Commonly known as:  GLUCOPHAGE Take 1,000 mg by mouth 2 (two) times daily with a meal.   milk thistle 175 MG tablet Take 175 mg by mouth daily.   MULTI COMPLETE PO Take by mouth.   oxyCODONE-acetaminophen 5-325 MG tablet Commonly known as:  PERCOCET/ROXICET Take 1 tablet by mouth every 4 (four) hours as needed for moderate pain.   quinapril 40 MG tablet Commonly known as:  ACCUPRIL Take 40 mg by mouth daily.   TRESIBA FLEXTOUCH 200 UNIT/ML Sopn Generic drug:  Insulin Degludec Inject 20 Units into the skin daily.       Contact information for follow-up providers    Clarisa Danser P, MD Follow up in 2 day(s).   Specialty:  Neurosurgery Contact information: [address] [place] [address] [phone]        Mariam DollarRAM,Hadley Soileau P, MD .   Specialty:  Neurosurgery Contact information: [address] [place] [address] [phone]            Contact information for after-discharge care    Destination    [facility] .   Specialty:  Skilled Nursing Facility Contact information: [address] [place] [id number] [phone]                  Signed: Mariam DollarCRAM,Christon Parada P 08/02/2016, 1:05 PM

## 2016-08-02 NOTE — Progress Notes (Signed)
Patient's medication retrieved from pharmacy and was returned to the patient.

## 2016-08-05 ENCOUNTER — Encounter: Payer: Self-pay | Admitting: Adult Health

## 2016-08-05 ENCOUNTER — Non-Acute Institutional Stay (SKILLED_NURSING_FACILITY): Payer: Medicare Other | Admitting: Adult Health

## 2016-08-05 DIAGNOSIS — M4316 Spondylolisthesis, lumbar region: Secondary | ICD-10-CM | POA: Diagnosis not present

## 2016-08-05 DIAGNOSIS — E1122 Type 2 diabetes mellitus with diabetic chronic kidney disease: Secondary | ICD-10-CM | POA: Diagnosis not present

## 2016-08-05 DIAGNOSIS — R2681 Unsteadiness on feet: Secondary | ICD-10-CM

## 2016-08-05 DIAGNOSIS — I10 Essential (primary) hypertension: Secondary | ICD-10-CM

## 2016-08-05 DIAGNOSIS — N189 Chronic kidney disease, unspecified: Secondary | ICD-10-CM | POA: Diagnosis not present

## 2016-08-05 NOTE — Progress Notes (Signed)
Patient ID: Cheryl Carey, female   DOB: 1946-01-13, 70 y.o.   MRN: [id number]    DATE:  08/05/2016   MRN:  [id number]  BIRTHDAY: 05-19-1946  Facility:  Nursing Home Location:  [facility]  Nursing Home Room Number: 1205-P  LEVEL OF CARE:  SNF [id number])  Contact Information    Name Relation Home Work Mobile   [place] Sister [phone]  [phone]   Rondell, Frick   [phone]       Code Status History    Date Active Date Inactive Code Status Order ID Comments User Context   07/29/2016  2:10 PM 08/02/2016  8:31 PM Full Code [id number]  Donalee Citrin, MD Inpatient   06/16/2014 10:24 PM 06/19/2014  3:37 PM Full Code [id number]  Yevonne Pax, MD Inpatient       Chief Complaint  Patient presents with  . Hospitalization Follow-up    HISTORY OF PRESENT ILLNESS:   This is a 71 year old female who has been admitted to [facility] on 08/02/16 from [facility] with Spondylolisthesis @ L4-L5 level S/P decompressive laminectomy and fusion @ L4-5. She complained of left calf pain while in the hospital and venous doppler was negative for DVT.  She was seen in her room toady and verbalized that her pain is well-controlled. She reported that she takes Humalog 4 units before lunch at home. Pre-lunch blood sugar is 178. She has requested for her loratadine and magnesium to be discontinued.  She has been admitted for a short-term rehabilitation.  PAST MEDICAL HISTORY:  Past Medical History:  Diagnosis Date  . Anemia    hx  . Arthritis   . Diabetes mellitus without complication (HCC)   . Headache   . Hypertension   . PONV (postoperative nausea and vomiting)   . Spondylolisthesis at L4-L5 level      CURRENT MEDICATIONS: Reviewed  Patient's Medications  New Prescriptions   No medications on file  Previous Medications   AMLODIPINE (NORVASC) 10 MG TABLET    Take 10 mg by mouth daily.    ASPIRIN 81 MG TABLET    Take 81 mg by mouth daily.    CYCLOBENZAPRINE (FLEXERIL) 5 MG TABLET    Take 5 mg by mouth 3 (three) times daily as needed for muscle spasms.   IBUPROFEN (ADVIL,MOTRIN) 600 MG TABLET    Take 1 tablet (600 mg total) by mouth every 8 (eight) hours as needed.   INSULIN DEGLUDEC (TRESIBA FLEXTOUCH) 200 UNIT/ML SOPN    Inject 20 Units into the skin daily.   INSULIN LISPRO (HUMALOG) 100 UNIT/ML INJECTION    Inject 2-6 Units into the skin See admin instructions. 6 units before breakfast, 2 units in evening   LISINOPRIL (PRINIVIL,ZESTRIL) 40 MG TABLET    Take 40 mg by mouth daily.   LORATADINE (CLARITIN) 10 MG TABLET    Take 10 mg by mouth daily as needed for allergies.   MAGNESIUM 30 MG TABLET    Take 30 mg by mouth daily.    MAGNESIUM HYDROXIDE (MILK OF MAGNESIA PO)    Take 45 mLs by mouth.   METFORMIN (GLUCOPHAGE) 500 MG TABLET    Take 1,000 mg by mouth 2 (two) times daily with a meal.   MULTIPLE VITAMINS-MINERALS (MULTI COMPLETE PO)    Take by mouth.   OXYCODONE-ACETAMINOPHEN (PERCOCET/ROXICET) 5-325 MG TABLET    Take 1 tablet by mouth every 4 (four) hours as needed for moderate pain.  Modified Medications   No medications  on file  Discontinued Medications   FLAXSEED, LINSEED, (FLAXSEED OIL PO)    Take 1,400 mg by mouth daily.   MILK THISTLE 175 MG TABLET    Take 175 mg by mouth daily.   QUINAPRIL (ACCUPRIL) 40 MG TABLET    Take 40 mg by mouth daily.     Allergies  Allergen Reactions  . Codeine Nausea Only     REVIEW OF SYSTEMS:  GENERAL: no change in appetite, no fatigue, no weight changes, no fever, chills or weakness EYES: Denies change in vision, dry eyes, eye pain, itching or discharge EARS: Denies change in hearing, ringing in ears, or earache NOSE: Denies nasal congestion or epistaxis MOUTH and THROAT: Denies oral discomfort, gingival pain or bleeding, pain from teeth or hoarseness   RESPIRATORY: no cough, SOB, DOE, wheezing, hemoptysis CARDIAC: no chest pain, or palpitations GI: no abdominal pain, diarrhea,  constipation, heart burn, nausea or vomiting GU: Denies dysuria, frequency, hematuria, incontinence, or discharge PSYCHIATRIC: Denies feeling of depression or anxiety. No report of hallucinations, insomnia, paranoia, or agitation    PHYSICAL EXAMINATION  GENERAL APPEARANCE: Well nourished. In no acute distress. Obese SKIN:  Lower back midline incision is covered with honeycomb dressing, dry, no redness HEAD: Normal in size and contour. No evidence of trauma EYES: Lids open and close normally. No blepharitis, entropion or ectropion. PERRL. Conjunctivae are clear and sclerae are white. Lenses are without opacity EARS: Pinnae are normal. Patient hears normal voice tunes of the examiner MOUTH and THROAT: Lips are without lesions. Oral mucosa is moist and without lesions. Tongue is normal in shape, size, and color and without lesions NECK: supple, trachea midline, no neck masses, no thyroid tenderness, no thyromegaly LYMPHATICS: no LAN in the neck, no supraclavicular LAN RESPIRATORY: breathing is even & unlabored, BS CTAB CARDIAC: RRR, no murmur,no extra heart sounds, BLE trace edema GI: abdomen soft, normal BS, no masses, no tenderness, no hepatomegaly, no splenomegaly EXTREMITIES:  Able to move X 4 extremities PSYCHIATRIC: Alert and oriented X 3. Affect and behavior are appropriate  LABS/RADIOLOGY: Labs reviewed: Basic Metabolic Panel:  Recent Labs  96/03/5407/14/17 1051  NA 140  K 4.2  CL 102  CO2 29  GLUCOSE 52*  BUN 24*  CREATININE 1.32*  CALCIUM 10.1   CBC:  Recent Labs  07/22/16 1051  WBC 6.6  HGB 12.1  HCT 37.9  MCV 93.6  PLT 308   CBG:  Recent Labs  08/02/16 0635 08/02/16 1142 08/02/16 1614  GLUCAP 132* 91 143*      Dg Lumbar Spine 2-3 Views  Result Date: 07/29/2016 CLINICAL DATA:  L4-5 PLIF EXAM: LUMBAR SPINE - 2-3 VIEW; DG C-ARM 61-120 MIN COMPARISON:  None. FLUOROSCOPY TIME:  Radiation Exposure Index (as provided by the fluoroscopic device): Not available  If the device does not provide the exposure index: Fluoroscopy Time:  1 minutes 24 seconds Number of Acquired Images:  2 FINDINGS: Pedicle screws are noted at L4 and L5 with interbody fusion. Mild anterolisthesis is again seen. The posterior fixator has not been placed. No acute abnormality is noted. IMPRESSION: Fusion at L4-5. Electronically Signed   By: Alcide CleverMark  Lukens M.D.   On: 07/29/2016 11:33   Dg C-arm 1-60 Min  Result Date: 07/29/2016 CLINICAL DATA:  L4-5 PLIF EXAM: LUMBAR SPINE - 2-3 VIEW; DG C-ARM 61-120 MIN COMPARISON:  None. FLUOROSCOPY TIME:  Radiation Exposure Index (as provided by the fluoroscopic device): Not available If the device does not provide the exposure index: Fluoroscopy Time:  1 minutes 24 seconds Number of Acquired Images:  2 FINDINGS: Pedicle screws are noted at L4 and L5 with interbody fusion. Mild anterolisthesis is again seen. The posterior fixator has not been placed. No acute abnormality is noted. IMPRESSION: Fusion at L4-5. Electronically Signed   By: Alcide Clever M.D.   On: 07/29/2016 11:33    ASSESSMENT/PLAN:  Unsteady gait - for rehabilitation, PT and OT, for strengthening exercises to help with stability; fall precaution  Spondylolisthesis @ L4-L5 S/P decompression and fusion - for rehabilitation, PT and OT; continue cyclobenzaprine 5 mg 1 tab by mouth 3 times a day when necessary for muscle spasm; Percocet 5/325 mg 1 tab by mouth every 4 hours when necessary for pain; follow-up with Dr. Wynetta Emery, neurosurgery; check CBC  Hypertension - continue amlodipine 10 mg 1 tab by mouth daily and Quinalapril 40 mg 1 tab by mouth daily  Diabetes mellitus, type II - continue Humalog 6 units before breakfast, 4 units before lunch and 2 units before supper SQ, metformin 500 mg take 2 tabs =  and Tresiba 200 units/ml give 20 units SQ daily; check hgbA1c  Chronic kidney disease, unspecified - monitor creatinine; check CMP Lab Results  Component Value Date   CREATININE 1.32 (H)  07/22/2016       Goals of care:  Short-term rehabilitation    Kenard Gower, NP [facility] [phone]

## 2016-08-06 ENCOUNTER — Encounter: Payer: Self-pay | Admitting: Internal Medicine

## 2016-08-06 ENCOUNTER — Non-Acute Institutional Stay (SKILLED_NURSING_FACILITY): Payer: Medicare Other | Admitting: Internal Medicine

## 2016-08-06 DIAGNOSIS — Z794 Long term (current) use of insulin: Secondary | ICD-10-CM

## 2016-08-06 DIAGNOSIS — E1122 Type 2 diabetes mellitus with diabetic chronic kidney disease: Secondary | ICD-10-CM

## 2016-08-06 DIAGNOSIS — I10 Essential (primary) hypertension: Secondary | ICD-10-CM

## 2016-08-06 DIAGNOSIS — M4316 Spondylolisthesis, lumbar region: Secondary | ICD-10-CM | POA: Diagnosis not present

## 2016-08-06 DIAGNOSIS — N189 Chronic kidney disease, unspecified: Secondary | ICD-10-CM

## 2016-08-06 NOTE — Progress Notes (Signed)
LOCATION: [facility]  PCP: Emeterio Reeve, MD   Code Status: Full Code  Goals of care: Advanced Directive information Advanced Directives 08/01/2016  Does patient have an advance directive? No  Would patient like information on creating an advanced directive? No - patient declined information  Pre-existing out of facility DNR order (yellow form or pink MOST form) -       Extended Emergency Contact Information Primary Emergency Contact: Rawe,Veronica Address: [address]          [place], [place] [address] [place] Home Phone: [phone] Mobile Phone: [phone] Relation: Sister Secondary Emergency Contact: [place] Mobile Phone: [phone] Relation: Brother   Allergies  Allergen Reactions  . Codeine Nausea Only    Chief Complaint  Patient presents with  . New Admit To SNF    New Admission     HPI:  Patient is a 70 y.o. female seen today for short term rehabilitation post hospital admission from 07/29/16-08/02/16 with lumbar spinal stenosis with spondylolisthesis at L4-L5. She underwent decompressive laminectomy and fusion at L4-L5. She is seen in her room today. Her pain medication has been helpful.    Review of Systems:  Constitutional: Negative for fever, chills, diaphoresis. Energy level is low right now.  HENT: Negative for headache, congestion, nasal discharge Eyes: Negative for blurred vision, double vision and discharge.  Respiratory: Negative for cough, shortness of breath and wheezing.   Cardiovascular: Negative for chest pain, palpitations, leg swelling.  Gastrointestinal: Negative for heartburn, nausea, vomiting, abdominal pain. Last bowel movement was 2 days ago.  Genitourinary: Negative for dysuria and flank pain.  Musculoskeletal: Negative for fall in the facility.  Skin: Negative for itching, rash.  Neurological: Positive for occasional dizziness. Psychiatric/Behavioral: Negative for  depression   Past Medical History:  Diagnosis Date  . Anemia    hx  . Arthritis   . Diabetes mellitus without complication (HCC)   . Headache   . Hypertension   . PONV (postoperative nausea and vomiting)   . Spondylolisthesis at L4-L5 level    Past Surgical History:  Procedure Laterality Date  . ABDOMINAL HYSTERECTOMY    . COLONOSCOPY    . DECOMPRESSIVE LAMINECTOMY AND FUSION L4-5  07/2016  . DILATION AND CURETTAGE OF UTERUS    . Right shoulder surgery     Social History:   reports that she has never smoked. She has never used smokeless tobacco. She reports that she does not drink alcohol or use drugs.  No family history on file.  Medications:   Medication List       Accurate as of 08/06/16  2:42 PM. Always use your most recent med list.          amLODipine 10 MG tablet Commonly known as:  NORVASC Take 10 mg by mouth daily.   aspirin 81 MG tablet Take 81 mg by mouth daily.   cyclobenzaprine 5 MG tablet Commonly known as:  FLEXERIL Take 5 mg by mouth 3 (three) times daily as needed for muscle spasms.   insulin lispro 100 UNIT/ML injection Commonly known as:  HUMALOG Inject 2-6 Units into the skin See admin instructions. 6 units before breakfast, 2 units in evening   lisinopril 40 MG tablet Commonly known as:  PRINIVIL,ZESTRIL Take 40 mg by mouth daily.   metFORMIN 500 MG tablet Commonly known as:  GLUCOPHAGE Take 1,000 mg by mouth 2 (two) times daily with a meal.   MULTI COMPLETE PO Take by  mouth.   oxyCODONE-acetaminophen 5-325 MG tablet Commonly known as:  PERCOCET/ROXICET Take 1 tablet by mouth every 4 (four) hours as needed for moderate pain.   TRESIBA FLEXTOUCH 200 UNIT/ML Sopn Generic drug:  Insulin Degludec Inject 20 Units into the skin daily.       Immunizations:  There is no immunization history on file for this patient.   Physical Exam:  Vitals:   08/06/16 1437  BP: (!) 150/64  Pulse: 85  Resp: 18  Temp: 99.5 F (37.5 C)    TempSrc: Oral  SpO2: 95%  Weight: 181 lb (82.1 kg)  Height: 5\' 1"  (1.549 m)   Body mass index is 34.2 kg/m.  General- elderly female, obese, in no acute distress Head- normocephalic, atraumatic Nose- no maxillary or frontal sinus tenderness, no nasal discharge Throat- moist mucus membrane Eyes- PERRLA, EOMI, no pallor, no icterus, no discharge, normal conjunctiva, normal sclera Neck- no cervical lymphadenopathy Cardiovascular- normal s1,s2, no murmur, trace leg edema Respiratory- bilateral clear to auscultation, no wheeze, no rhonchi, no crackles, no use of accessory muscles Abdomen- bowel sounds present, soft, non tender Musculoskeletal- able to move all 4 extremities, generalized weakness, back brace present Neurological- alert and oriented to person, place and time Skin- warm and dry, lumbar surgical incision with honeycomb dressing Psychiatry- normal mood and affect    Labs reviewed: Basic Metabolic Panel:  Recent Labs  40/98/11 1051  NA 140  K 4.2  CL 102  CO2 29  GLUCOSE 52*  BUN 24*  CREATININE 1.32*  CALCIUM 10.1   Liver Function Tests: No results for input(s): AST, ALT, ALKPHOS, BILITOT, PROT, ALBUMIN in the last 8760 hours. No results for input(s): LIPASE, AMYLASE in the last 8760 hours. No results for input(s): AMMONIA in the last 8760 hours. CBC:  Recent Labs  07/22/16 1051  WBC 6.6  HGB 12.1  HCT 37.9  MCV 93.6  PLT 308   Cardiac Enzymes: No results for input(s): CKTOTAL, CKMB, CKMBINDEX, TROPONINI in the last 8760 hours. BNP: Invalid input(s): POCBNP CBG:  Recent Labs  08/02/16 0635 08/02/16 1142 08/02/16 1614  GLUCAP 132* 91 143*    Radiological Exams: Dg Lumbar Spine 2-3 Views  Result Date: 07/29/2016 CLINICAL DATA:  L4-5 PLIF EXAM: LUMBAR SPINE - 2-3 VIEW; DG C-ARM 61-120 MIN COMPARISON:  None. FLUOROSCOPY TIME:  Radiation Exposure Index (as provided by the fluoroscopic device): Not available If the device does not provide the  exposure index: Fluoroscopy Time:  1 minutes 24 seconds Number of Acquired Images:  2 FINDINGS: Pedicle screws are noted at L4 and L5 with interbody fusion. Mild anterolisthesis is again seen. The posterior fixator has not been placed. No acute abnormality is noted. IMPRESSION: Fusion at L4-5. Electronically Signed   By: Alcide Clever M.D.   On: 07/29/2016 11:33   Dg C-arm 1-60 Min  Result Date: 07/29/2016 CLINICAL DATA:  L4-5 PLIF EXAM: LUMBAR SPINE - 2-3 VIEW; DG C-ARM 61-120 MIN COMPARISON:  None. FLUOROSCOPY TIME:  Radiation Exposure Index (as provided by the fluoroscopic device): Not available If the device does not provide the exposure index: Fluoroscopy Time:  1 minutes 24 seconds Number of Acquired Images:  2 FINDINGS: Pedicle screws are noted at L4 and L5 with interbody fusion. Mild anterolisthesis is again seen. The posterior fixator has not been placed. No acute abnormality is noted. IMPRESSION: Fusion at L4-5. Electronically Signed   By: Alcide Clever M.D.   On: 07/29/2016 11:33    Assessment/Plan  Lumbar spondylolisthesis S/p  decompression and fusion. To wear her back brace when out of bed. Has follow up with neurosurgery. Continue percocet 5-325 mg q4h prn pain. Will have her work with physical therapy and occupational therapy team to help with gait training and muscle strengthening exercises.fall precautions. Skin care. Encourage to be out of bed. Continue flexeril 5 mg tid prn and monitor  Hypertension Monitor bp reading, continue quinapril 40 mg daily with amlodipine 10 mg daily and monitor Bmp  Dm type 2 with renal complications Lab Results  Component Value Date   HGBA1C 9.7 (H) 06/17/2014   Monitor cbg tid with meals. Continue metformin 1000 mg bid. Check a1c. Continue humalog 6 u before breakfast, 4 u with lunch and 2 u with dinner and tresiba 20 units SQ daily.  ckd Monitor BMP    Goals of care: short term rehabilitation   Labs/tests ordered: cbc, cmp  Family/ staff  Communication: reviewed care plan with patient and nursing supervisor    Oneal GroutMAHIMA Krzysztof Reichelt, MD Internal Medicine [facility] [address] [place], [place] [address] Cell Phone (Monday-Friday 8 am - 5 pm): [phone] On Call: [phone] and follow prompts after 5 pm and on weekends Office Phone: [phone] Office Fax: [phone]

## 2016-08-07 LAB — CBC AND DIFFERENTIAL
HCT: 25 % — AB (ref 36–46)
HEMOGLOBIN: 7.8 g/dL — AB (ref 12.0–16.0)
Neutrophils Absolute: 6 /uL
PLATELETS: 387 10*3/uL (ref 150–399)
WBC: 8.7 10^3/mL

## 2016-08-07 LAB — BASIC METABOLIC PANEL
BUN: 27 mg/dL — AB (ref 4–21)
CREATININE: 1.1 mg/dL (ref 0.5–1.1)
Glucose: 302 mg/dL
Potassium: 5.3 mmol/L (ref 3.4–5.3)
SODIUM: 137 mmol/L (ref 137–147)

## 2016-08-07 LAB — HEMOGLOBIN A1C: HEMOGLOBIN A1C: 7.6

## 2016-08-08 ENCOUNTER — Non-Acute Institutional Stay (SKILLED_NURSING_FACILITY): Payer: Medicare Other | Admitting: Adult Health

## 2016-08-08 ENCOUNTER — Encounter: Payer: Self-pay | Admitting: Adult Health

## 2016-08-08 DIAGNOSIS — D62 Acute posthemorrhagic anemia: Secondary | ICD-10-CM

## 2016-08-08 DIAGNOSIS — K5901 Slow transit constipation: Secondary | ICD-10-CM | POA: Diagnosis not present

## 2016-08-08 NOTE — Progress Notes (Signed)
Patient ID: Cheryl Carey, female   DOB: 05-31-1946, 70 y.o.   MRN: [id number]    DATE:   08/08/16  MRN:  [id number]  BIRTHDAY: 30-Apr-1946  Facility:  Nursing Home Location:  [facility]  Nursing Home Room Number: 1205-P  LEVEL OF CARE:  SNF [phone])  Contact Information    Name Relation Home Work Mobile   Cheryl Carey Sister [phone]  [phone]   Cheryl Carey, [name]   [phone]       Code Status History    Date Active Date Inactive Code Status Order ID Comments User Context   07/29/2016  2:10 PM 08/02/2016  8:31 PM Full Code [id number]  Cheryl Citrin, MD Inpatient   06/16/2014 10:24 PM 06/19/2014  3:37 PM Full Code [id number]  Cheryl Pax, MD Inpatient       Chief Complaint  Patient presents with  . Acute Visit    Anemia    HISTORY OF PRESENT ILLNESS:   This is a 70 year old female who is being seen due to latest hgb 7.8 . Hgb dropped from 12.1 taken on 8/14 . No complaints of dizziness.  She has been admitted to [facility] on 08/02/16 from [facility] with Spondylolisthesis @ L4-L5 level S/P decompressive laminectomy and fusion @ L4-5. She complained of left calf pain while in the hospital and venous doppler was negative for DVT.   PAST MEDICAL HISTORY:  Past Medical History:  Diagnosis Date  . Anemia    hx  . Arthritis   . Diabetes mellitus without complication (HCC)   . Headache   . Hypertension   . PONV (postoperative nausea and vomiting)   . Spondylolisthesis at L4-L5 level      CURRENT MEDICATIONS: Reviewed  Patient's Medications  New Prescriptions   No medications on file  Previous Medications   AMLODIPINE (NORVASC) 10 MG TABLET    Take 10 mg by mouth daily.    ASPIRIN 81 MG TABLET    Take 81 mg by mouth daily.    CYCLOBENZAPRINE (FLEXERIL) 5 MG TABLET    Take 5 mg by mouth 3 (three) times daily as needed for muscle spasms.   FERROUS SULFATE 325 (65 FE) MG TABLET    Take 325 mg by mouth 2 (two) times daily with  a meal.   INSULIN DEGLUDEC (TRESIBA FLEXTOUCH) 200 UNIT/ML SOPN    Inject 20 Units into the skin daily.   INSULIN LISPRO (HUMALOG) 100 UNIT/ML INJECTION    Inject 2-6 Units into the skin See admin instructions. 6 units before breakfast, 4 units before lunch, 2 units in evening   LISINOPRIL (PRINIVIL,ZESTRIL) 40 MG TABLET    Take 40 mg by mouth daily.   METFORMIN (GLUCOPHAGE) 500 MG TABLET    Take 1,000 mg by mouth 2 (two) times daily with a meal.   MULTIPLE VITAMINS-MINERALS (MULTI COMPLETE PO)    Take by mouth.   OXYCODONE-ACETAMINOPHEN (PERCOCET/ROXICET) 5-325 MG TABLET    Take 1 tablet by mouth every 4 (four) hours as needed for moderate pain.  Modified Medications   No medications on file  Discontinued Medications   No medications on file     Allergies  Allergen Reactions  . Codeine Nausea Only     REVIEW OF SYSTEMS:  GENERAL: no change in appetite, no fatigue, no weight changes, no fever, chills or weakness EYES: Denies change in vision, dry eyes, eye pain, itching or discharge EARS: Denies change in hearing, ringing in ears, or earache NOSE: Denies  nasal congestion or epistaxis MOUTH and THROAT: Denies oral discomfort, gingival pain or bleeding, pain from teeth or hoarseness   RESPIRATORY: no cough, SOB, DOE, wheezing, hemoptysis CARDIAC: no chest pain, or palpitations GI: no abdominal pain, diarrhea, constipation, heart burn, nausea or vomiting GU: Denies dysuria, frequency, hematuria, incontinence, or discharge PSYCHIATRIC: Denies feeling of depression or anxiety. No report of hallucinations, insomnia, paranoia, or agitation    PHYSICAL EXAMINATION  GENERAL APPEARANCE: Well nourished. In no acute distress. Obese SKIN:  Lower back midline incision is covered with honeycomb dressing, dry, no redness HEAD: Normal in size and contour. No evidence of trauma EYES: Lids open and close normally. No blepharitis, entropion or ectropion. PERRL. Conjunctivae are clear and sclerae  are white. Lenses are without opacity EARS: Pinnae are normal. Patient hears normal voice tunes of the examiner MOUTH and THROAT: Lips are without lesions. Oral mucosa is moist and without lesions. Tongue is normal in shape, size, and color and without lesions NECK: supple, trachea midline, no neck masses, no thyroid tenderness, no thyromegaly LYMPHATICS: no LAN in the neck, no supraclavicular LAN RESPIRATORY: breathing is even & unlabored, BS CTAB CARDIAC: RRR, no murmur,no extra heart sounds, BLE 1+ edema GI: abdomen soft, normal BS, no masses, no tenderness, no hepatomegaly, no splenomegaly EXTREMITIES:  Able to move X 4 extremities PSYCHIATRIC: Alert and oriented X 3. Affect and behavior are appropriate  LABS/RADIOLOGY: Labs reviewed: Basic Metabolic Panel:  Recent Labs  40/98/11 1051 08/07/16 1413  NA 140 137  K 4.2 5.3  CL 102  --   CO2 29  --   GLUCOSE 52*  --   BUN 24* 27*  CREATININE 1.32* 1.1  CALCIUM 10.1  --    CBC:  Recent Labs  07/22/16 1051 08/07/16 1413  WBC 6.6 8.7  NEUTROABS  --  6  HGB 12.1 7.8*  HCT 37.9 25*  MCV 93.6  --   PLT 308 387   CBG:  Recent Labs  08/02/16 0635 08/02/16 1142 08/02/16 1614  GLUCAP 132* 91 143*      Dg Lumbar Spine 2-3 Views  Result Date: 07/29/2016 CLINICAL DATA:  L4-5 PLIF EXAM: LUMBAR SPINE - 2-3 VIEW; DG C-ARM 61-120 MIN COMPARISON:  None. FLUOROSCOPY TIME:  Radiation Exposure Index (as provided by the fluoroscopic device): Not available If the device does not provide the exposure index: Fluoroscopy Time:  1 minutes 24 seconds Number of Acquired Images:  2 FINDINGS: Pedicle screws are noted at L4 and L5 with interbody fusion. Mild anterolisthesis is again seen. The posterior fixator has not been placed. No acute abnormality is noted. IMPRESSION: Fusion at L4-5. Electronically Signed   By: Alcide Clever M.D.   On: 07/29/2016 11:33   Dg C-arm 1-60 Min  Result Date: 07/29/2016 CLINICAL DATA:  L4-5 PLIF EXAM: LUMBAR  SPINE - 2-3 VIEW; DG C-ARM 61-120 MIN COMPARISON:  None. FLUOROSCOPY TIME:  Radiation Exposure Index (as provided by the fluoroscopic device): Not available If the device does not provide the exposure index: Fluoroscopy Time:  1 minutes 24 seconds Number of Acquired Images:  2 FINDINGS: Pedicle screws are noted at L4 and L5 with interbody fusion. Mild anterolisthesis is again seen. The posterior fixator has not been placed. No acute abnormality is noted. IMPRESSION: Fusion at L4-5. Electronically Signed   By: Alcide Clever M.D.   On: 07/29/2016 11:33    ASSESSMENT/PLAN:  Anemia, acute blood loss - start FeSO4 325 mg 1 tab PO BID; check CBC in AM  and in 1 week Lab Results  Component Value Date   HGB 7.8 (A) 08/07/2016   Constipation - start Prune juice on breakfast and lunch trays daily      Kenard GowerMonina Medina-Vargas, NP [facility] [phone]

## 2016-08-13 LAB — BASIC METABOLIC PANEL
BUN: 18 mg/dL (ref 4–21)
CREATININE: 1 mg/dL (ref 0.5–1.1)
Glucose: 91 mg/dL
Potassium: 4.8 mmol/L (ref 3.4–5.3)
Sodium: 142 mmol/L (ref 137–147)

## 2016-08-13 LAB — CBC AND DIFFERENTIAL
HCT: 26 % — AB (ref 36–46)
Hemoglobin: 7.9 g/dL — AB (ref 12.0–16.0)
Neutrophils Absolute: 2 /uL
PLATELETS: 522 10*3/uL — AB (ref 150–399)
WBC: 7.5 10*3/mL

## 2016-08-19 ENCOUNTER — Encounter: Payer: Self-pay | Admitting: Adult Health

## 2016-08-19 ENCOUNTER — Non-Acute Institutional Stay (SKILLED_NURSING_FACILITY): Payer: Medicare Other | Admitting: Adult Health

## 2016-08-19 DIAGNOSIS — R2681 Unsteadiness on feet: Secondary | ICD-10-CM

## 2016-08-19 DIAGNOSIS — K5901 Slow transit constipation: Secondary | ICD-10-CM | POA: Diagnosis not present

## 2016-08-19 DIAGNOSIS — D62 Acute posthemorrhagic anemia: Secondary | ICD-10-CM | POA: Diagnosis not present

## 2016-08-19 DIAGNOSIS — E1122 Type 2 diabetes mellitus with diabetic chronic kidney disease: Secondary | ICD-10-CM

## 2016-08-19 DIAGNOSIS — M4316 Spondylolisthesis, lumbar region: Secondary | ICD-10-CM | POA: Diagnosis not present

## 2016-08-19 DIAGNOSIS — I10 Essential (primary) hypertension: Secondary | ICD-10-CM | POA: Diagnosis not present

## 2016-08-19 DIAGNOSIS — Z794 Long term (current) use of insulin: Secondary | ICD-10-CM

## 2016-08-19 DIAGNOSIS — N189 Chronic kidney disease, unspecified: Secondary | ICD-10-CM

## 2016-08-19 NOTE — Progress Notes (Signed)
Patient ID: Cheryl Carey, female   DOB: Aug 08, 1946, 70 y.o.   MRN: [id number]    DATE:  08/19/16  MRN:  [id number]  BIRTHDAY: 03/13/1946  Facility:  Nursing Home Location:  [facility]  Nursing Home Room Number: 1205-P  LEVEL OF CARE:  SNF [phone])  Contact Information    Name Relation Home Work Mobile   [place] Sister [phone]  [phone]   Delayne, [place]   [phone]       Code Status History    Date Active Date Inactive Code Status Order ID Comments User Context   07/29/2016  2:10 PM 08/02/2016  8:31 PM Full Code [id number]  Donalee Citrin, MD Inpatient   06/16/2014 10:24 PM 06/19/2014  3:37 PM Full Code [id number]  Yevonne Pax, MD Inpatient       Chief Complaint  Patient presents with  . Discharge Note    HISTORY OF PRESENT ILLNESS:   This is a 70 year old female who is for discharge home with Home health PT, OT and CNA. She will discharge with medications and prescriptions. DME:  Rolling walker with seat and Bedside commode.  She has been admitted to [facility] on 08/02/16 from [facility] with Spondylolisthesis @ L4-L5 level S/P decompressive laminectomy and fusion @ L4-5. She complained of left calf pain while in the hospital and venous doppler was negative for DVT.  Patient was admitted to this facility for short-term rehabilitation after the patient's recent hospitalization.  Patient has completed SNF rehabilitation and therapy has cleared the patient for discharge.  PAST MEDICAL HISTORY:  Past Medical History:  Diagnosis Date  . Anemia    hx  . Arthritis   . Diabetes mellitus without complication (HCC)   . Headache   . Hypertension   . PONV (postoperative nausea and vomiting)   . Spondylolisthesis at L4-L5 level      CURRENT MEDICATIONS: Reviewed  Patient's Medications  New Prescriptions   No medications on file  Previous Medications   AMLODIPINE (NORVASC) 10 MG TABLET    Take 10 mg by mouth daily.    ASPIRIN 81 MG TABLET    Take 81 mg by mouth daily.    CYCLOBENZAPRINE (FLEXERIL) 5 MG TABLET    Take 5 mg by mouth 3 (three) times daily as needed for muscle spasms.   FERROUS SULFATE 325 (65 FE) MG TABLET    Take 325 mg by mouth 2 (two) times daily with a meal.   FUROSEMIDE (LASIX) 20 MG TABLET    Take 20 mg by mouth daily.   INSULIN DEGLUDEC (TRESIBA FLEXTOUCH) 200 UNIT/ML SOPN    Inject 20 Units into the skin daily.   INSULIN LISPRO (HUMALOG) 100 UNIT/ML INJECTION    Inject 2-6 Units into the skin See admin instructions. 6 units before breakfast, 4 units before lunch, 2 units in evening   LISINOPRIL (PRINIVIL,ZESTRIL) 40 MG TABLET    Take 40 mg by mouth daily.   METFORMIN (GLUCOPHAGE) 500 MG TABLET    Take 1,000 mg by mouth 2 (two) times daily with a meal.   MULTIPLE VITAMINS-MINERALS (MULTI COMPLETE PO)    Take 1 tablet by mouth daily.    OXYCODONE-ACETAMINOPHEN (PERCOCET/ROXICET) 5-325 MG TABLET    Take 1 tablet by mouth every 4 (four) hours as needed for moderate pain.  Modified Medications   No medications on file  Discontinued Medications   No medications on file     Allergies  Allergen Reactions  . Codeine Nausea  Only     REVIEW OF SYSTEMS:  GENERAL: no change in appetite, no fatigue, no weight changes, no fever, chills or weakness EYES: Denies change in vision, dry eyes, eye pain, itching or discharge EARS: Denies change in hearing, ringing in ears, or earache NOSE: Denies nasal congestion or epistaxis MOUTH and THROAT: Denies oral discomfort, gingival pain or bleeding, pain from teeth or hoarseness   RESPIRATORY: no cough, SOB, DOE, wheezing, hemoptysis CARDIAC: no chest pain, or palpitations GI: no abdominal pain, diarrhea, constipation, heart burn, nausea or vomiting GU: Denies dysuria, frequency, hematuria, incontinence, or discharge PSYCHIATRIC: Denies feeling of depression or anxiety. No report of hallucinations, insomnia, paranoia, or agitation    PHYSICAL  EXAMINATION  GENERAL APPEARANCE: Well nourished. In no acute distress. Obese SKIN:  Lower back midline incision is healed HEAD: Normal in size and contour. No evidence of trauma EYES: Lids open and close normally. No blepharitis, entropion or ectropion. PERRL. Conjunctivae are clear and sclerae are white. Lenses are without opacity EARS: Pinnae are normal. Patient hears normal voice tunes of the examiner MOUTH and THROAT: Lips are without lesions. Oral mucosa is moist and without lesions. Tongue is normal in shape, size, and color and without lesions NECK: supple, trachea midline, no neck masses, no thyroid tenderness, no thyromegaly LYMPHATICS: no LAN in the neck, no supraclavicular LAN RESPIRATORY: breathing is even & unlabored, BS CTAB CARDIAC: RRR, no murmur,no extra heart sounds, BLE trace edema GI: abdomen soft, normal BS, no masses, no tenderness, no hepatomegaly, no splenomegaly EXTREMITIES:  Able to move X 4 extremities PSYCHIATRIC: Alert and oriented X 3. Affect and behavior are appropriate  LABS/RADIOLOGY: Labs reviewed: Basic Metabolic Panel:  Recent Labs  91/47/82 1051 08/07/16 1413 08/13/16 1247  NA 140 137 142  K 4.2 5.3 4.8  CL 102  --   --   CO2 29  --   --   GLUCOSE 52*  --   --   BUN 24* 27* 18  CREATININE 1.32* 1.1 1.0  CALCIUM 10.1  --   --    CBC:  Recent Labs  07/22/16 1051 08/07/16 1413 08/13/16 1247  WBC 6.6 8.7 7.5  NEUTROABS  --  6 2  HGB 12.1 7.8* 7.9*  HCT 37.9 25* 26*  MCV 93.6  --   --   PLT 308 387 522*   CBG:  Recent Labs  08/02/16 0635 08/02/16 1142 08/02/16 1614  GLUCAP 132* 91 143*      Dg Lumbar Spine 2-3 Views  Result Date: 07/29/2016 CLINICAL DATA:  L4-5 PLIF EXAM: LUMBAR SPINE - 2-3 VIEW; DG C-ARM 61-120 MIN COMPARISON:  None. FLUOROSCOPY TIME:  Radiation Exposure Index (as provided by the fluoroscopic device): Not available If the device does not provide the exposure index: Fluoroscopy Time:  1 minutes 24 seconds  Number of Acquired Images:  2 FINDINGS: Pedicle screws are noted at L4 and L5 with interbody fusion. Mild anterolisthesis is again seen. The posterior fixator has not been placed. No acute abnormality is noted. IMPRESSION: Fusion at L4-5. Electronically Signed   By: Alcide Clever M.D.   On: 07/29/2016 11:33   Dg C-arm 1-60 Min  Result Date: 07/29/2016 CLINICAL DATA:  L4-5 PLIF EXAM: LUMBAR SPINE - 2-3 VIEW; DG C-ARM 61-120 MIN COMPARISON:  None. FLUOROSCOPY TIME:  Radiation Exposure Index (as provided by the fluoroscopic device): Not available If the device does not provide the exposure index: Fluoroscopy Time:  1 minutes 24 seconds Number of Acquired Images:  2 FINDINGS: Pedicle screws are noted at L4 and L5 with interbody fusion. Mild anterolisthesis is again seen. The posterior fixator has not been placed. No acute abnormality is noted. IMPRESSION: Fusion at L4-5. Electronically Signed   By: Alcide CleverMark  Lukens M.D.   On: 07/29/2016 11:33    ASSESSMENT/PLAN:  Unsteady gait - for Home health PT, OT and CNA, for strengthening exercises to help with stability; fall precaution  Spondylolisthesis @ L4-L5 S/P decompression and fusion - for Home health CNA, PT and OT; continue cyclobenzaprine 5 mg 1 tab by mouth 3 times a day when necessary for muscle spasm; Percocet 5/325 mg 1 tab by mouth every 4 hours when necessary for pain; follow-up with Dr. Wynetta Emeryram, neurosurgery  Hypertension - continue amlodipine 10 mg 1 tab by mouth daily and Quinalapril 40 mg 1 tab by mouth daily  Diabetes mellitus, type II - continue Humalog 6 units before breakfast, 4 units before lunch and 2 units before supper SQ, metformin 500 mg take 2 tabs =  and Tresiba 200 units/ml give 20 units SQ daily  Chronic kidney disease, unspecified - stable Lab Results  Component Value Date   CREATININE 1.0 08/13/2016   Anemia, acute blood loss -  Recently started on FeSO4 325 mg 1 tab BID Lab Results  Component Value Date   HGB 7.9 (A)  08/13/2016   Constipation - continue prune juice on breakfast and lunch      I have filled out patient's discharge paperwork and written prescriptions.  Patient will receive home health PT, OT and CNA.  DME provided:  Rolling walker with seat and Bedside commode  Total discharge time: Greater than 30 minutes Greater than 50% was spent in counseling and coordination of care with the patient.   Discharge time involved coordination of the discharge process with social worker, nursing staff and therapy department. Medical justification for home health services/DME verified.    Kenard GowerMonina Medina-Vargas, NP [facility] [phone]

## 2016-11-29 ENCOUNTER — Other Ambulatory Visit: Payer: Self-pay | Admitting: Neurosurgery

## 2016-11-29 DIAGNOSIS — M4316 Spondylolisthesis, lumbar region: Secondary | ICD-10-CM

## 2016-12-23 ENCOUNTER — Ambulatory Visit
Admission: RE | Admit: 2016-12-23 | Discharge: 2016-12-23 | Disposition: A | Discharge status: Home / Self Care | Payer: Medicare Other | Source: Ambulatory Visit | Attending: Neurosurgery | Admitting: Neurosurgery

## 2016-12-23 DIAGNOSIS — M4316 Spondylolisthesis, lumbar region: Secondary | ICD-10-CM

## 2018-01-19 ENCOUNTER — Emergency Department (HOSPITAL_COMMUNITY): Payer: Medicare Other

## 2018-01-19 ENCOUNTER — Inpatient Hospital Stay (HOSPITAL_COMMUNITY): Payer: Medicare Other

## 2018-01-19 ENCOUNTER — Inpatient Hospital Stay (HOSPITAL_COMMUNITY)
Admission: EM | Admit: 2018-01-19 | Discharge: 2018-01-24 | DRG: 637 | Disposition: A | Discharge status: Home / Self Care | Payer: Medicare Other | Attending: Internal Medicine | Admitting: Internal Medicine

## 2018-01-19 ENCOUNTER — Encounter (HOSPITAL_COMMUNITY): Payer: Self-pay | Admitting: Oncology

## 2018-01-19 ENCOUNTER — Other Ambulatory Visit: Payer: Self-pay

## 2018-01-19 DIAGNOSIS — M4316 Spondylolisthesis, lumbar region: Secondary | ICD-10-CM | POA: Diagnosis present

## 2018-01-19 DIAGNOSIS — D631 Anemia in chronic kidney disease: Secondary | ICD-10-CM | POA: Diagnosis present

## 2018-01-19 DIAGNOSIS — E118 Type 2 diabetes mellitus with unspecified complications: Secondary | ICD-10-CM | POA: Diagnosis not present

## 2018-01-19 DIAGNOSIS — R4182 Altered mental status, unspecified: Secondary | ICD-10-CM | POA: Diagnosis not present

## 2018-01-19 DIAGNOSIS — M1990 Unspecified osteoarthritis, unspecified site: Secondary | ICD-10-CM | POA: Diagnosis present

## 2018-01-19 DIAGNOSIS — I361 Nonrheumatic tricuspid (valve) insufficiency: Secondary | ICD-10-CM | POA: Diagnosis not present

## 2018-01-19 DIAGNOSIS — E042 Nontoxic multinodular goiter: Secondary | ICD-10-CM

## 2018-01-19 DIAGNOSIS — M6282 Rhabdomyolysis: Secondary | ICD-10-CM | POA: Diagnosis present

## 2018-01-19 DIAGNOSIS — E1122 Type 2 diabetes mellitus with diabetic chronic kidney disease: Secondary | ICD-10-CM | POA: Diagnosis present

## 2018-01-19 DIAGNOSIS — Y92009 Unspecified place in unspecified non-institutional (private) residence as the place of occurrence of the external cause: Secondary | ICD-10-CM | POA: Diagnosis not present

## 2018-01-19 DIAGNOSIS — Z794 Long term (current) use of insulin: Secondary | ICD-10-CM

## 2018-01-19 DIAGNOSIS — N179 Acute kidney failure, unspecified: Secondary | ICD-10-CM | POA: Diagnosis present

## 2018-01-19 DIAGNOSIS — I129 Hypertensive chronic kidney disease with stage 1 through stage 4 chronic kidney disease, or unspecified chronic kidney disease: Secondary | ICD-10-CM | POA: Diagnosis present

## 2018-01-19 DIAGNOSIS — N183 Chronic kidney disease, stage 3 unspecified: Secondary | ICD-10-CM | POA: Diagnosis present

## 2018-01-19 DIAGNOSIS — R040 Epistaxis: Secondary | ICD-10-CM | POA: Diagnosis present

## 2018-01-19 DIAGNOSIS — G9341 Metabolic encephalopathy: Secondary | ICD-10-CM | POA: Diagnosis present

## 2018-01-19 DIAGNOSIS — E041 Nontoxic single thyroid nodule: Secondary | ICD-10-CM | POA: Diagnosis not present

## 2018-01-19 DIAGNOSIS — R680 Hypothermia, not associated with low environmental temperature: Secondary | ICD-10-CM | POA: Diagnosis present

## 2018-01-19 DIAGNOSIS — T68XXXA Hypothermia, initial encounter: Secondary | ICD-10-CM | POA: Diagnosis not present

## 2018-01-19 DIAGNOSIS — K920 Hematemesis: Secondary | ICD-10-CM | POA: Diagnosis present

## 2018-01-19 DIAGNOSIS — W07XXXA Fall from chair, initial encounter: Secondary | ICD-10-CM | POA: Diagnosis present

## 2018-01-19 DIAGNOSIS — Z885 Allergy status to narcotic agent status: Secondary | ICD-10-CM

## 2018-01-19 DIAGNOSIS — E872 Acidosis, unspecified: Secondary | ICD-10-CM

## 2018-01-19 DIAGNOSIS — D649 Anemia, unspecified: Secondary | ICD-10-CM | POA: Diagnosis not present

## 2018-01-19 DIAGNOSIS — R220 Localized swelling, mass and lump, head: Secondary | ICD-10-CM | POA: Diagnosis not present

## 2018-01-19 DIAGNOSIS — Z79899 Other long term (current) drug therapy: Secondary | ICD-10-CM

## 2018-01-19 DIAGNOSIS — R52 Pain, unspecified: Secondary | ICD-10-CM

## 2018-01-19 DIAGNOSIS — R6510 Systemic inflammatory response syndrome (SIRS) of non-infectious origin without acute organ dysfunction: Secondary | ICD-10-CM | POA: Diagnosis present

## 2018-01-19 DIAGNOSIS — M5440 Lumbago with sciatica, unspecified side: Secondary | ICD-10-CM | POA: Diagnosis not present

## 2018-01-19 DIAGNOSIS — E869 Volume depletion, unspecified: Secondary | ICD-10-CM | POA: Diagnosis present

## 2018-01-19 DIAGNOSIS — G8929 Other chronic pain: Secondary | ICD-10-CM | POA: Diagnosis present

## 2018-01-19 DIAGNOSIS — Z7982 Long term (current) use of aspirin: Secondary | ICD-10-CM

## 2018-01-19 DIAGNOSIS — R410 Disorientation, unspecified: Secondary | ICD-10-CM | POA: Diagnosis not present

## 2018-01-19 DIAGNOSIS — Z880 Allergy status to penicillin: Secondary | ICD-10-CM

## 2018-01-19 DIAGNOSIS — M62830 Muscle spasm of back: Secondary | ICD-10-CM | POA: Diagnosis present

## 2018-01-19 DIAGNOSIS — W19XXXA Unspecified fall, initial encounter: Secondary | ICD-10-CM

## 2018-01-19 DIAGNOSIS — A419 Sepsis, unspecified organism: Secondary | ICD-10-CM

## 2018-01-19 DIAGNOSIS — E160 Drug-induced hypoglycemia without coma: Secondary | ICD-10-CM | POA: Diagnosis not present

## 2018-01-19 DIAGNOSIS — E11649 Type 2 diabetes mellitus with hypoglycemia without coma: Secondary | ICD-10-CM | POA: Diagnosis present

## 2018-01-19 DIAGNOSIS — E876 Hypokalemia: Secondary | ICD-10-CM

## 2018-01-19 DIAGNOSIS — E119 Type 2 diabetes mellitus without complications: Secondary | ICD-10-CM

## 2018-01-19 DIAGNOSIS — R402413 Glasgow coma scale score 13-15, at hospital admission: Secondary | ICD-10-CM | POA: Diagnosis present

## 2018-01-19 DIAGNOSIS — I10 Essential (primary) hypertension: Secondary | ICD-10-CM | POA: Diagnosis present

## 2018-01-19 DIAGNOSIS — M545 Low back pain: Secondary | ICD-10-CM | POA: Diagnosis present

## 2018-01-19 DIAGNOSIS — Y92230 Patient room in hospital as the place of occurrence of the external cause: Secondary | ICD-10-CM | POA: Diagnosis not present

## 2018-01-19 DIAGNOSIS — R404 Transient alteration of awareness: Secondary | ICD-10-CM | POA: Diagnosis not present

## 2018-01-19 DIAGNOSIS — E1165 Type 2 diabetes mellitus with hyperglycemia: Secondary | ICD-10-CM | POA: Diagnosis present

## 2018-01-19 DIAGNOSIS — R112 Nausea with vomiting, unspecified: Secondary | ICD-10-CM

## 2018-01-19 DIAGNOSIS — T383X5A Adverse effect of insulin and oral hypoglycemic [antidiabetic] drugs, initial encounter: Secondary | ICD-10-CM | POA: Diagnosis not present

## 2018-01-19 DIAGNOSIS — T360X5A Adverse effect of penicillins, initial encounter: Secondary | ICD-10-CM | POA: Diagnosis not present

## 2018-01-19 DIAGNOSIS — R070 Pain in throat: Secondary | ICD-10-CM | POA: Diagnosis not present

## 2018-01-19 DIAGNOSIS — R748 Abnormal levels of other serum enzymes: Secondary | ICD-10-CM | POA: Diagnosis present

## 2018-01-19 DIAGNOSIS — E1169 Type 2 diabetes mellitus with other specified complication: Secondary | ICD-10-CM | POA: Diagnosis present

## 2018-01-19 DIAGNOSIS — Z981 Arthrodesis status: Secondary | ICD-10-CM

## 2018-01-19 DIAGNOSIS — T796XXA Traumatic ischemia of muscle, initial encounter: Secondary | ICD-10-CM | POA: Diagnosis not present

## 2018-01-19 LAB — GLUCOSE, CAPILLARY
GLUCOSE-CAPILLARY: 238 mg/dL — AB (ref 65–99)
GLUCOSE-CAPILLARY: 175 mg/dL — AB (ref 65–99)
Glucose-Capillary: 200 mg/dL — ABNORMAL HIGH (ref 65–99)
Glucose-Capillary: 209 mg/dL — ABNORMAL HIGH (ref 65–99)
Glucose-Capillary: 398 mg/dL — ABNORMAL HIGH (ref 65–99)

## 2018-01-19 LAB — COMPREHENSIVE METABOLIC PANEL
ALT: 19 U/L (ref 14–54)
ANION GAP: 14 (ref 5–15)
AST: 34 U/L (ref 15–41)
Albumin: 3.1 g/dL — ABNORMAL LOW (ref 3.5–5.0)
Alkaline Phosphatase: 69 U/L (ref 38–126)
BILIRUBIN TOTAL: 0.5 mg/dL (ref 0.3–1.2)
BUN: 26 mg/dL — AB (ref 6–20)
CO2: 20 mmol/L — ABNORMAL LOW (ref 22–32)
Calcium: 8.2 mg/dL — ABNORMAL LOW (ref 8.9–10.3)
Chloride: 107 mmol/L (ref 101–111)
Creatinine, Ser: 1.54 mg/dL — ABNORMAL HIGH (ref 0.44–1.00)
GFR calc Af Amer: 37 mL/min — ABNORMAL LOW (ref >60)
GFR, EST NON AFRICAN AMERICAN: 32 mL/min — AB (ref >60)
Glucose, Bld: 122 mg/dL — ABNORMAL HIGH (ref 65–99)
POTASSIUM: 3.2 mmol/L — AB (ref 3.5–5.1)
Sodium: 141 mmol/L (ref 135–145)
TOTAL PROTEIN: 5.8 g/dL — AB (ref 6.5–8.1)

## 2018-01-19 LAB — CBC WITH DIFFERENTIAL/PLATELET
BASOS PCT: 0 %
Basophils Absolute: 0 10*3/uL (ref 0.0–0.1)
EOS ABS: 0 10*3/uL (ref 0.0–0.7)
Eosinophils Relative: 0 %
HCT: 34.9 % — ABNORMAL LOW (ref 36.0–46.0)
Hemoglobin: 11.1 g/dL — ABNORMAL LOW (ref 12.0–15.0)
Lymphocytes Relative: 18 %
Lymphs Abs: 1.5 10*3/uL (ref 0.7–4.0)
MCH: 29.4 pg (ref 26.0–34.0)
MCHC: 31.8 g/dL (ref 30.0–36.0)
MCV: 92.3 fL (ref 78.0–100.0)
MONO ABS: 0.3 10*3/uL (ref 0.1–1.0)
MONOS PCT: 3 %
Neutro Abs: 6.6 10*3/uL (ref 1.7–7.7)
Neutrophils Relative %: 79 %
PLATELETS: 260 10*3/uL (ref 150–400)
RBC: 3.78 MIL/uL — ABNORMAL LOW (ref 3.87–5.11)
RDW: 13.2 % (ref 11.5–15.5)
WBC: 8.5 10*3/uL (ref 4.0–10.5)

## 2018-01-19 LAB — CK TOTAL AND CKMB (NOT AT ARMC)
CK, MB: 9.7 ng/mL — ABNORMAL HIGH (ref 0.5–5.0)
RELATIVE INDEX: 1.5 (ref 0.0–2.5)
Total CK: 630 U/L — ABNORMAL HIGH (ref 38–234)

## 2018-01-19 LAB — HEPATIC FUNCTION PANEL
ALBUMIN: 3.6 g/dL (ref 3.5–5.0)
ALK PHOS: 80 U/L (ref 38–126)
ALT: 25 U/L (ref 14–54)
AST: 55 U/L — AB (ref 15–41)
BILIRUBIN TOTAL: 0.8 mg/dL (ref 0.3–1.2)
Bilirubin, Direct: <0.1 mg/dL — ABNORMAL LOW (ref 0.1–0.5)
Total Protein: 7 g/dL (ref 6.5–8.1)

## 2018-01-19 LAB — I-STAT CG4 LACTIC ACID, ED
LACTIC ACID, VENOUS: 5.97 mmol/L — AB (ref 0.5–1.9)
Lactic Acid, Venous: 5.12 mmol/L (ref 0.5–1.9)

## 2018-01-19 LAB — I-STAT CHEM 8, ED
BUN: 26 mg/dL — ABNORMAL HIGH (ref 6–20)
Calcium, Ion: 1.08 mmol/L — ABNORMAL LOW (ref 1.15–1.40)
Chloride: 105 mmol/L (ref 101–111)
Creatinine, Ser: 1.4 mg/dL — ABNORMAL HIGH (ref 0.44–1.00)
Glucose, Bld: 115 mg/dL — ABNORMAL HIGH (ref 65–99)
HEMATOCRIT: 34 % — AB (ref 36.0–46.0)
HEMOGLOBIN: 11.6 g/dL — AB (ref 12.0–15.0)
Potassium: 3.2 mmol/L — ABNORMAL LOW (ref 3.5–5.1)
SODIUM: 143 mmol/L (ref 135–145)
TCO2: 22 mmol/L (ref 22–32)

## 2018-01-19 LAB — CBC
HEMATOCRIT: 38.7 % (ref 36.0–46.0)
HEMOGLOBIN: 12.5 g/dL (ref 12.0–15.0)
MCH: 29.6 pg (ref 26.0–34.0)
MCHC: 32.3 g/dL (ref 30.0–36.0)
MCV: 91.5 fL (ref 78.0–100.0)
Platelets: 272 10*3/uL (ref 150–400)
RBC: 4.23 MIL/uL (ref 3.87–5.11)
RDW: 13 % (ref 11.5–15.5)
WBC: 13.6 10*3/uL — AB (ref 4.0–10.5)

## 2018-01-19 LAB — BASIC METABOLIC PANEL
ANION GAP: 17 — AB (ref 5–15)
BUN: 31 mg/dL — ABNORMAL HIGH (ref 6–20)
CO2: 21 mmol/L — ABNORMAL LOW (ref 22–32)
Calcium: 9.5 mg/dL (ref 8.9–10.3)
Chloride: 99 mmol/L — ABNORMAL LOW (ref 101–111)
Creatinine, Ser: 1.67 mg/dL — ABNORMAL HIGH (ref 0.44–1.00)
GFR calc Af Amer: 34 mL/min — ABNORMAL LOW (ref >60)
GFR, EST NON AFRICAN AMERICAN: 29 mL/min — AB (ref >60)
Glucose, Bld: 176 mg/dL — ABNORMAL HIGH (ref 65–99)
POTASSIUM: 4.2 mmol/L (ref 3.5–5.1)
SODIUM: 137 mmol/L (ref 135–145)

## 2018-01-19 LAB — CBG MONITORING, ED: Glucose-Capillary: 123 mg/dL — ABNORMAL HIGH (ref 65–99)

## 2018-01-19 LAB — HEMOGLOBIN A1C
Hgb A1c MFr Bld: 7.9 % — ABNORMAL HIGH (ref 4.8–5.6)
Mean Plasma Glucose: 180.03 mg/dL

## 2018-01-19 LAB — PROCALCITONIN

## 2018-01-19 LAB — I-STAT TROPONIN, ED: TROPONIN I, POC: 0.04 ng/mL (ref 0.00–0.08)

## 2018-01-19 LAB — CORTISOL: Cortisol, Plasma: 39.4 ug/dL

## 2018-01-19 LAB — CK: Total CK: 1859 U/L — ABNORMAL HIGH (ref 38–234)

## 2018-01-19 LAB — TROPONIN I
TROPONIN I: 0.19 ng/mL — AB (ref <0.03)
Troponin I: 0.11 ng/mL (ref <0.03)
Troponin I: 0.15 ng/mL (ref <0.03)

## 2018-01-19 LAB — LACTIC ACID, PLASMA: LACTIC ACID, VENOUS: 3.7 mmol/L — AB (ref 0.5–1.9)

## 2018-01-19 LAB — TSH: TSH: 1.503 u[IU]/mL (ref 0.350–4.500)

## 2018-01-19 LAB — MAGNESIUM: Magnesium: 2 mg/dL (ref 1.7–2.4)

## 2018-01-19 LAB — MRSA PCR SCREENING: MRSA by PCR: NEGATIVE

## 2018-01-19 MED ORDER — SODIUM CHLORIDE 0.9 % IV SOLN
INTRAVENOUS | Status: DC
  Administered 2018-01-19: 05:00:00 via INTRAVENOUS

## 2018-01-19 MED ORDER — INSULIN GLARGINE 100 UNIT/ML ~~LOC~~ SOLN
10.0000 [IU] | Freq: Every day | SUBCUTANEOUS | Status: DC
Start: 2018-01-19 — End: 2018-01-20
  Administered 2018-01-19: 10 [IU] via SUBCUTANEOUS
  Filled 2018-01-19: qty 0.1

## 2018-01-19 MED ORDER — INSULIN GLARGINE 100 UNIT/ML ~~LOC~~ SOLN: 10.0000 [IU] | Freq: Every day | SUBCUTANEOUS | Status: DC

## 2018-01-19 MED ORDER — ACETAMINOPHEN 650 MG RE SUPP: 650.0000 mg | Freq: Four times a day (QID) | RECTAL | Status: DC | PRN

## 2018-01-19 MED ORDER — SODIUM CHLORIDE 0.9 % IV SOLN
500.0000 mg | Freq: Once | INTRAVENOUS | Status: AC
  Administered 2018-01-19: 500 mg via INTRAVENOUS
  Filled 2018-01-19: qty 500

## 2018-01-19 MED ORDER — INSULIN DEGLUDEC 200 UNIT/ML ~~LOC~~ SOPN: 10.0000 [IU] | PEN_INJECTOR | Freq: Every day | SUBCUTANEOUS | Status: DC

## 2018-01-19 MED ORDER — VANCOMYCIN HCL 10 G IV SOLR: 1250.0000 mg | INTRAVENOUS | Status: DC

## 2018-01-19 MED ORDER — SODIUM CHLORIDE 0.9 % IV SOLN: INTRAVENOUS | Status: DC

## 2018-01-19 MED ORDER — QUINAPRIL HCL 10 MG PO TABS
40.0000 mg | ORAL_TABLET | Freq: Every day | ORAL | Status: DC
  Filled 2018-01-19: qty 4

## 2018-01-19 MED ORDER — VANCOMYCIN HCL IN DEXTROSE 1-5 GM/200ML-% IV SOLN
1000.0000 mg | Freq: Once | INTRAVENOUS | Status: AC
  Administered 2018-01-19: 1000 mg via INTRAVENOUS
  Filled 2018-01-19: qty 200

## 2018-01-19 MED ORDER — SODIUM CHLORIDE 0.9 % IV SOLN
1.0000 g | Freq: Three times a day (TID) | INTRAVENOUS | Status: DC
  Administered 2018-01-19: 1 g via INTRAVENOUS
  Filled 2018-01-19 (×2): qty 1

## 2018-01-19 MED ORDER — PIPERACILLIN-TAZOBACTAM 3.375 G IVPB 30 MIN
3.3750 g | Freq: Once | INTRAVENOUS | Status: AC
  Administered 2018-01-19: 3.375 g via INTRAVENOUS
  Filled 2018-01-19: qty 50

## 2018-01-19 MED ORDER — SODIUM CHLORIDE 0.9 % IV BOLUS (SEPSIS): 500.0000 mL | Freq: Once | INTRAVENOUS | Status: DC

## 2018-01-19 MED ORDER — LABETALOL HCL 5 MG/ML IV SOLN: 10.0000 mg | Freq: Once | INTRAVENOUS | Status: DC

## 2018-01-19 MED ORDER — TRAMADOL HCL 50 MG PO TABS: 50.0000 mg | ORAL_TABLET | Freq: Two times a day (BID) | ORAL | Status: DC | PRN

## 2018-01-19 MED ORDER — DIPHENHYDRAMINE HCL 50 MG/ML IJ SOLN
25.0000 mg | Freq: Once | INTRAMUSCULAR | Status: AC
  Administered 2018-01-19: 25 mg via INTRAVENOUS

## 2018-01-19 MED ORDER — SODIUM CHLORIDE 0.9 % IV BOLUS (SEPSIS)
500.0000 mL | Freq: Once | INTRAVENOUS | Status: AC
  Administered 2018-01-19: 500 mL via INTRAVENOUS

## 2018-01-19 MED ORDER — ASPIRIN 81 MG PO CHEW: 81.0000 mg | CHEWABLE_TABLET | Freq: Every day | ORAL | Status: DC

## 2018-01-19 MED ORDER — POTASSIUM CHLORIDE 10 MEQ/100ML IV SOLN
10.0000 meq | INTRAVENOUS | Status: DC
  Administered 2018-01-19 (×2): 10 meq via INTRAVENOUS
  Filled 2018-01-19 (×3): qty 100

## 2018-01-19 MED ORDER — INSULIN ASPART 100 UNIT/ML ~~LOC~~ SOLN
0.0000 [IU] | SUBCUTANEOUS | Status: DC
  Administered 2018-01-19: 5 [IU] via SUBCUTANEOUS

## 2018-01-19 MED ORDER — OXYCODONE HCL 5 MG PO TABS: 5.0000 mg | ORAL_TABLET | Freq: Four times a day (QID) | ORAL | Status: DC | PRN

## 2018-01-19 MED ORDER — FAMOTIDINE IN NACL 20-0.9 MG/50ML-% IV SOLN
20.0000 mg | Freq: Two times a day (BID) | INTRAVENOUS | Status: AC
  Administered 2018-01-19 (×2): 20 mg via INTRAVENOUS
  Filled 2018-01-19 (×3): qty 50

## 2018-01-19 MED ORDER — INSULIN ASPART 100 UNIT/ML ~~LOC~~ SOLN: 0.0000 [IU] | Freq: Three times a day (TID) | SUBCUTANEOUS | Status: DC

## 2018-01-19 MED ORDER — SODIUM CHLORIDE 0.9 % IV BOLUS (SEPSIS): 1000.0000 mL | Freq: Once | INTRAVENOUS | Status: DC

## 2018-01-19 MED ORDER — HYDRALAZINE HCL 20 MG/ML IJ SOLN
10.0000 mg | INTRAMUSCULAR | Status: DC | PRN
  Administered 2018-01-19 – 2018-01-23 (×5): 10 mg via INTRAVENOUS
  Filled 2018-01-19 (×5): qty 1

## 2018-01-19 MED ORDER — ENOXAPARIN SODIUM 40 MG/0.4ML ~~LOC~~ SOLN: 40.0000 mg | SUBCUTANEOUS | Status: DC

## 2018-01-19 MED ORDER — DIPHENHYDRAMINE HCL 50 MG/ML IJ SOLN
INTRAMUSCULAR | Status: AC
  Filled 2018-01-19: qty 1

## 2018-01-19 MED ORDER — INSULIN ASPART 100 UNIT/ML ~~LOC~~ SOLN: 0.0000 [IU] | Freq: Every day | SUBCUTANEOUS | Status: DC

## 2018-01-19 MED ORDER — CYCLOBENZAPRINE HCL 10 MG PO TABS
5.0000 mg | ORAL_TABLET | Freq: Three times a day (TID) | ORAL | Status: DC | PRN
  Administered 2018-01-20 – 2018-01-24 (×5): 5 mg via ORAL
  Filled 2018-01-19 (×5): qty 1

## 2018-01-19 MED ORDER — METHYLPREDNISOLONE SODIUM SUCC 125 MG IJ SOLR
60.0000 mg | Freq: Once | INTRAMUSCULAR | Status: AC
  Administered 2018-01-19: 60 mg via INTRAVENOUS
  Filled 2018-01-19: qty 2

## 2018-01-19 MED ORDER — SODIUM CHLORIDE 0.9 % IV BOLUS (SEPSIS)
2000.0000 mL | Freq: Once | INTRAVENOUS | Status: AC
  Administered 2018-01-19: 2000 mL via INTRAVENOUS

## 2018-01-19 MED ORDER — INSULIN ASPART 100 UNIT/ML ~~LOC~~ SOLN
4.0000 [IU] | Freq: Every day | SUBCUTANEOUS | Status: DC
  Administered 2018-01-20 – 2018-01-22 (×3): 4 [IU] via SUBCUTANEOUS

## 2018-01-19 MED ORDER — AMLODIPINE BESYLATE 10 MG PO TABS: 10.0000 mg | ORAL_TABLET | Freq: Every day | ORAL | Status: DC

## 2018-01-19 MED ORDER — PIPERACILLIN-TAZOBACTAM 3.375 G IVPB
3.3750 g | Freq: Three times a day (TID) | INTRAVENOUS | Status: DC
  Filled 2018-01-19 (×2): qty 50

## 2018-01-19 MED ORDER — DEXTROSE 5 % IV SOLN
1.0000 g | Freq: Three times a day (TID) | INTRAVENOUS | Status: DC
  Filled 2018-01-19 (×2): qty 1

## 2018-01-19 MED ORDER — INSULIN ASPART 100 UNIT/ML ~~LOC~~ SOLN
0.0000 [IU] | Freq: Three times a day (TID) | SUBCUTANEOUS | Status: DC
  Administered 2018-01-19: 2 [IU] via SUBCUTANEOUS
  Administered 2018-01-20: 7 [IU] via SUBCUTANEOUS
  Administered 2018-01-20: 5 [IU] via SUBCUTANEOUS
  Administered 2018-01-21: 2 [IU] via SUBCUTANEOUS
  Administered 2018-01-21: 5 [IU] via SUBCUTANEOUS
  Administered 2018-01-22: 2 [IU] via SUBCUTANEOUS
  Administered 2018-01-22: 3 [IU] via SUBCUTANEOUS
  Administered 2018-01-22: 5 [IU] via SUBCUTANEOUS

## 2018-01-19 MED ORDER — INSULIN DEGLUDEC 200 UNIT/ML ~~LOC~~ SOPN: 20.0000 [IU] | PEN_INJECTOR | Freq: Every day | SUBCUTANEOUS | Status: DC

## 2018-01-19 MED ORDER — LATANOPROST 0.005 % OP SOLN
1.0000 [drp] | Freq: Every day | OPHTHALMIC | Status: DC
  Administered 2018-01-19 – 2018-01-23 (×5): 1 [drp] via OPHTHALMIC
  Filled 2018-01-19 (×2): qty 2.5

## 2018-01-19 MED ORDER — CYCLOBENZAPRINE HCL 10 MG PO TABS: 5.0000 mg | ORAL_TABLET | Freq: Three times a day (TID) | ORAL | Status: DC | PRN

## 2018-01-19 MED ORDER — INSULIN ASPART 100 UNIT/ML ~~LOC~~ SOLN
6.0000 [IU] | Freq: Every day | SUBCUTANEOUS | Status: DC
  Administered 2018-01-21 – 2018-01-22 (×2): 6 [IU] via SUBCUTANEOUS

## 2018-01-19 MED ORDER — INSULIN LISPRO 100 UNIT/ML ~~LOC~~ SOLN
2.0000 [IU] | SUBCUTANEOUS | Status: DC
Start: 2018-01-19 — End: 2018-01-19

## 2018-01-19 MED ORDER — ACETAMINOPHEN 325 MG PO TABS
650.0000 mg | ORAL_TABLET | Freq: Four times a day (QID) | ORAL | Status: DC | PRN
  Administered 2018-01-23: 650 mg via ORAL
  Filled 2018-01-19: qty 2

## 2018-01-19 MED ORDER — INSULIN ASPART 100 UNIT/ML ~~LOC~~ SOLN
2.0000 [IU] | Freq: Every day | SUBCUTANEOUS | Status: DC
  Administered 2018-01-19 – 2018-01-22 (×4): 2 [IU] via SUBCUTANEOUS

## 2018-01-19 NOTE — Procedures (Signed)
ELECTROENCEPHALOGRAM REPORT  Date of Study: 01/19/2018  Patient's Name: Cheryl BaldingJoyce A Lipscomb MRN: [id number] Date of Birth: 1946-07-10  Referring Provider: Midge MiniumArshad Kakrakandy, MD  Clinical History: 72 year old female found confused.  Medications: [employer]  Technical Summary: A multichannel digital EEG recording measured by the international 10-20 system with electrodes applied with paste and impedances below 5000 ohms performed in our laboratory with EKG monitoring in an awake and drowsy patient.  Hyperventilation and photic stimulation were not performed.  The digital EEG was referentially recorded, reformatted, and digitally filtered in a variety of bipolar and referential montages for optimal display.    Description: The patient is awake and drowsy during the recording.  During maximal wakefulness, there is a symmetric, medium voltage 9 Hz posterior dominant rhythm that attenuates with eye opening.  The record is symmetric.  During drowsiness, there is an increase in theta slowing of the background.  Stage 2 sleep is not seen.  There were no epileptiform discharges or electrographic seizures seen.    EKG lead was unremarkable.  Impression: This awake and drowsy EEG is normal.    Clinical Correlation: A normal EEG does not exclude a clinical diagnosis of epilepsy.  If further clinical questions remain, prolonged EEG may be helpful.  Clinical correlation is advised.   Shon MilletAdam Davionne Dowty, DO

## 2018-01-19 NOTE — ED Notes (Signed)
Sister [place]: Marolyn HammockVeronica [place] # [phone]. Brother in [place]: Eveline KetoLeonard Jiron [phone]

## 2018-01-19 NOTE — ED Notes (Addendum)
Nurse starting IV and getting blood. 

## 2018-01-19 NOTE — Progress Notes (Signed)
Patient arrived to floor from ER with lips swollen and red appeared to be having allergic reaction. Breathing stable she c/o of sore throat. I called the  MD and made him aware and requesting meds for reaction. I spoke with pharmacy they believed it was possible reaction from zosyn which will be listed as allergy. The MD came to bedside and assessed the patient the plan is to continue to monitor patient.

## 2018-01-19 NOTE — Progress Notes (Signed)
Pharmacy Antibiotic Note  Cheryl Carey is a 72 y.o. female admitted on 01/19/2018 with sepsis.  Pharmacy has been consulted for Vancocin and Zosyn dosing.  Plan: Vancomycin 1500mg  x1 then 1250mg  IV every 24 hours.  Goal trough 15-20 mcg/mL. Zosyn 3.375g IV q8h (4 hour infusion).  Height: 5\' 1"  (154.9 cm) Weight: 181 lb (82.1 kg) IBW/kg (Calculated) : 47.8  Temp (24hrs), Avg:90.1 F (32.3 C), Min:90.1 F (32.3 C), Max:90.1 F (32.3 C)  Recent Labs  Lab 01/19/18 0138 01/19/18 0146  WBC 8.5  --   CREATININE 1.54* 1.40*  LATICACIDVEN  --  5.97*    Estimated Creatinine Clearance: 34.2 mL/min (A) (by C-G formula based on SCr of 1.4 mg/dL (H)).    Allergies  Allergen Reactions  . Codeine Nausea Only     Thank you for allowing pharmacy to be a part of this patient's care.  Cheryl Carey, PharmD, BCPS  01/19/2018 3:23 AM

## 2018-01-19 NOTE — Progress Notes (Signed)
EEG completed; results pending.    

## 2018-01-19 NOTE — H&P (Addendum)
History and Physical    Cheryl Carey ZOX:[id number] DOB: 08-Sep-1946 DOA: 01/19/2018  PCP: Mila Palmer, MD  Patient coming from: Home.  Chief Complaint: Not responding to calls.  HPI: Cheryl Carey is a 72 y.o. female with history of hypertension, diabetes mellitus and chronic low back pain was not responding to calls and neighbors had to break in.  Patient states that last evening around 6 PM she had checked the sugar was low and she had supper and following which she talked to her sister.  At around 8 PM she was supposed to call her sister again but since she did not call back to her sister was trying to reach her.  Since they were unable to reach the patient for almost 3-4 hours the neighbors had to call him please had to break in.  Patient was found to be fallen on her chair onto the floor.  Patient was alert awake initially appeared confused.  There was blood on nostrils and forehead.  Blood sugar at the site was 120 as per the ER physician.  Patient did not have any chest pain or shortness of breath or had no weakness of the upper lower extremities.  Denies any headache abdominal pain diarrhea nausea vomiting.  ED Course: In the ER patient had a CT of the head and neck which did not show anything acute except for thyroid nodules.  Acute abdominal series was unremarkable.  EKG was showing normal sinus rhythm with QTC of 491 ms.  Patient was found to be hypothermic with temperature in the 90 F.  Lactic acid was elevated at 5.  UA is still pending.  Blood cultures were obtained and patient was empirically started on vancomycin and Zosyn for possible sepsis and TSH and cortisol levels were ordered.  Admission it was noted that patient had some swelling of the lower lip.  Review of Systems: As per HPI, rest all negative.   Past Medical History:  Diagnosis Date  . Anemia    hx  . Arthritis   . Diabetes mellitus without complication (HCC)   . Headache   . Hypertension   . PONV  (postoperative nausea and vomiting)   . Spondylolisthesis at L4-L5 level     Past Surgical History:  Procedure Laterality Date  . ABDOMINAL HYSTERECTOMY    . COLONOSCOPY    . DECOMPRESSIVE LAMINECTOMY AND FUSION L4-5  07/2016  . DILATION AND CURETTAGE OF UTERUS    . Right shoulder surgery       reports that  has never smoked. she has never used smokeless tobacco. She reports that she does not drink alcohol or use drugs.  Allergies  Allergen Reactions  . Codeine Nausea Only    Family History  Problem Relation Age of Onset  . Diabetes Mellitus II Mother   . Stroke Sister     Prior to Admission medications   Medication Sig Start Date End Date Taking? Authorizing Provider  amLODipine (NORVASC) 10 MG tablet Take 10 mg by mouth daily.    Yes [provider]  aspirin 81 MG tablet Take 81 mg by mouth daily.    Yes [provider]  cyclobenzaprine (FLEXERIL) 5 MG tablet Take 5 mg by mouth 3 (three) times daily as needed for muscle spasms.   Yes [provider]  ferrous sulfate 325 (65 FE) MG tablet Take 325 mg by mouth 2 (two) times daily with a meal.   Yes [provider]  furosemide (LASIX) 20  MG tablet Take 20 mg by mouth daily.   Yes [provider]  Insulin Degludec (TRESIBA FLEXTOUCH) 200 UNIT/ML SOPN Inject 20 Units into the skin daily.   Yes [provider]  insulin lispro (HUMALOG) 100 UNIT/ML injection Inject 2-6 Units into the skin See admin instructions. 6 units before breakfast, 4 units before lunch, 2 units in evening   Yes [provider]  latanoprost (XALATAN) 0.005 % ophthalmic solution Place 1 drop into both eyes at bedtime. 12/25/17  Yes [provider]  metFORMIN (GLUCOPHAGE) 500 MG tablet Take 1,000 mg by mouth 2 (two) times daily with a meal.   Yes [provider]  Multiple Vitamins-Minerals (MULTI COMPLETE PO) Take 1 tablet by mouth daily.    Yes [provider]  quinapril  (ACCUPRIL) 40 MG tablet Take 40 mg by mouth daily. 12/20/17  Yes [provider]  oxyCODONE-acetaminophen (PERCOCET/ROXICET) 5-325 MG tablet Take 1 tablet by mouth every 4 (four) hours as needed for moderate pain. Patient not taking: Reported on 01/19/2018 08/02/16   Donalee Citrin, MD    Physical Exam: Vitals:   01/19/18 0106 01/19/18 0124 01/19/18 0230  BP:  124/84 (!) 150/77  Pulse:  80 81  Resp:  (!) 28 (!) 22  Temp:  (!) 90.1 F (32.3 C)   TempSrc:  Rectal   SpO2: 96% 100% 96%      Constitutional: Moderately built and nourished. Vitals:   01/19/18 0106 01/19/18 0124 01/19/18 0230  BP:  124/84 (!) 150/77  Pulse:  80 81  Resp:  (!) 28 (!) 22  Temp:  (!) 90.1 F (32.3 C)   TempSrc:  Rectal   SpO2: 96% 100% 96%   Eyes: Anicteric no pallor. ENMT: Mild swelling of the lower lip as noted nurse on admission which was persistent.  No tongue swelling. Neck: No neck rigidity no mass felt. Respiratory: No rhonchi or crepitations. Cardiovascular: S1-S2 heard no murmurs appreciated. Abdomen: Soft nontender bowel sounds present. Musculoskeletal: No edema.  No joint effusion. Skin: No rash. Neurologic: Alert awake oriented to time place and person.  Moves all extremities 5 x 5.  No facial extremity except for lower lip swelling. Psychiatric: Appears normal.  Normal affect.   Labs on Admission: I have personally reviewed following labs and imaging studies  CBC: Recent Labs  Lab 01/19/18 0138 01/19/18 0146  WBC 8.5  --   NEUTROABS 6.6  --   HGB 11.1* 11.6*  HCT 34.9* 34.0*  MCV 92.3  --   PLT 260  --    Basic Metabolic Panel: Recent Labs  Lab 01/19/18 0138 01/19/18 0146  NA 141 143  K 3.2* 3.2*  CL 107 105  CO2 20*  --   GLUCOSE 122* 115*  BUN 26* 26*  CREATININE 1.54* 1.40*  CALCIUM 8.2*  --    GFR: CrCl cannot be calculated (Unknown ideal weight.). Liver Function Tests: Recent Labs  Lab 01/19/18 0138  AST 34  ALT 19  ALKPHOS 69  BILITOT 0.5  PROT  5.8*  ALBUMIN 3.1*   No results for input(s): LIPASE, AMYLASE in the last 168 hours. No results for input(s): AMMONIA in the last 168 hours. Coagulation Profile: No results for input(s): INR, PROTIME in the last 168 hours. Cardiac Enzymes: No results for input(s): CKTOTAL, CKMB, CKMBINDEX, TROPONINI in the last 168 hours. BNP (last 3 results) No results for input(s): PROBNP in the last 8760 hours. HbA1C: No results for input(s): HGBA1C in the last 72 hours.  CBG: Recent Labs  Lab 01/19/18 0123  GLUCAP 123*   Lipid Profile: No results for input(s): CHOL, HDL, LDLCALC, TRIG, CHOLHDL, LDLDIRECT in the last 72 hours. Thyroid Function Tests: No results for input(s): TSH, T4TOTAL, FREET4, T3FREE, THYROIDAB in the last 72 hours. Anemia Panel: No results for input(s): VITAMINB12, FOLATE, FERRITIN, TIBC, IRON, RETICCTPCT in the last 72 hours. Urine analysis:    Component Value Date/Time   COLORURINE YELLOW 06/16/2014 1936   APPEARANCEUR CLEAR 06/16/2014 1936   LABSPEC 1.030 06/16/2014 1936   PHURINE 5.0 06/16/2014 1936   GLUCOSEU >1000 (A) 06/16/2014 1936   HGBUR NEGATIVE 06/16/2014 1936   BILIRUBINUR NEGATIVE 06/16/2014 1936   KETONESUR 15 (A) 06/16/2014 1936   PROTEINUR NEGATIVE 06/16/2014 1936   UROBILINOGEN 0.2 06/16/2014 1936   NITRITE NEGATIVE 06/16/2014 1936   LEUKOCYTESUR NEGATIVE 06/16/2014 1936   Sepsis Labs: @LABRCNTIP (procalcitonin:4,lacticidven:4) )No results found for this or any previous visit (from the past 240 hour(s)).   Radiological Exams on Admission: Ct Head Wo Contrast  Result Date: 01/19/2018 CLINICAL DATA:  72 y/o  F; found on floor covered in vomitus. EXAM: CT HEAD WITHOUT CONTRAST CT CERVICAL SPINE WITHOUT CONTRAST TECHNIQUE: Multidetector CT imaging of the head and cervical spine was performed following the standard protocol without intravenous contrast. Multiplanar CT image reconstructions of the cervical spine were also generated. COMPARISON:  None.  FINDINGS: CT HEAD FINDINGS Brain: No evidence of acute infarction, hemorrhage, hydrocephalus, extra-axial collection or mass lesion/mass effect. Mild chronic microvascular ischemic changes and parenchymal volume loss of the brain. Vascular: Calcific atherosclerosis of carotid siphons. No hyperdense vessel. Skull: Normal. Negative for fracture or focal lesion. Sinuses/Orbits: No acute finding. Other: None. CT CERVICAL SPINE FINDINGS Alignment: Straightening of cervical lordosis without listhesis. Skull base and vertebrae: No acute fracture. No primary bone lesion or focal pathologic process. Soft tissues and spinal canal: No prevertebral fluid or swelling. No visible canal hematoma. Disc levels: Moderate cervical spondylosis with predominant discogenic degenerative changes. Multilevel disc space narrowing and marginal osteophytes. Uncovertebral hypertrophy encroaches on the left C3-4, bilateral C4-5, right C5-6, and bilateral C6-7 neural foramen. No high-grade bony canal stenosis. Upper chest: Negative. Other: Multiple thyroid nodules measuring up to 17 mm in the left lobe. Carotid and aortic calcific atherosclerosis. IMPRESSION: 1. No acute intracranial abnormality or calvarial fracture. 2. No acute fracture or dislocation of cervical spine. 3. Mild chronic microvascular ischemic changes and parenchymal volume loss of the brain. 4. Moderate cervical spondylosis with predominant discogenic degenerative changes. 5. Thyroid nodules measuring up to 17 mm. Thyroid ultrasound is recommended on a nonemergent basis for further evaluation. Electronically Signed   By: Mitzi Hansen M.D.   On: 01/19/2018 02:22   Ct Cervical Spine Wo Contrast  Result Date: 01/19/2018 CLINICAL DATA:  72 y/o  F; found on floor covered in vomitus. EXAM: CT HEAD WITHOUT CONTRAST CT CERVICAL SPINE WITHOUT CONTRAST TECHNIQUE: Multidetector CT imaging of the head and cervical spine was performed following the standard protocol without  intravenous contrast. Multiplanar CT image reconstructions of the cervical spine were also generated. COMPARISON:  None. FINDINGS: CT HEAD FINDINGS Brain: No evidence of acute infarction, hemorrhage, hydrocephalus, extra-axial collection or mass lesion/mass effect. Mild chronic microvascular ischemic changes and parenchymal volume loss of the brain. Vascular: Calcific atherosclerosis of carotid siphons. No hyperdense vessel. Skull: Normal. Negative for fracture or focal lesion. Sinuses/Orbits: No acute finding. Other: None. CT CERVICAL SPINE FINDINGS Alignment: Straightening of cervical lordosis without listhesis. Skull base and vertebrae: No acute fracture. No  primary bone lesion or focal pathologic process. Soft tissues and spinal canal: No prevertebral fluid or swelling. No visible canal hematoma. Disc levels: Moderate cervical spondylosis with predominant discogenic degenerative changes. Multilevel disc space narrowing and marginal osteophytes. Uncovertebral hypertrophy encroaches on the left C3-4, bilateral C4-5, right C5-6, and bilateral C6-7 neural foramen. No high-grade bony canal stenosis. Upper chest: Negative. Other: Multiple thyroid nodules measuring up to 17 mm in the left lobe. Carotid and aortic calcific atherosclerosis. IMPRESSION: 1. No acute intracranial abnormality or calvarial fracture. 2. No acute fracture or dislocation of cervical spine. 3. Mild chronic microvascular ischemic changes and parenchymal volume loss of the brain. 4. Moderate cervical spondylosis with predominant discogenic degenerative changes. 5. Thyroid nodules measuring up to 17 mm. Thyroid ultrasound is recommended on a nonemergent basis for further evaluation. Electronically Signed   By: Mitzi HansenLance  Furusawa-Stratton M.D.   On: 01/19/2018 02:22   Dg Abdomen Acute W/chest  Result Date: 01/19/2018 CLINICAL DATA:  Pain after fall EXAM: DG ABDOMEN ACUTE W/ 1V CHEST COMPARISON:  05/06/2017 FINDINGS: There is no evidence of dilated  bowel loops or free intraperitoneal air. Nonobstructed, nondistended bowel gas pattern. Redemonstration of bilateral pedicle screw fixation at L4 and L5. Single screw fixation of the right glenoid. Heart size and mediastinal contours are within normal limits. Both lungs are clear. IMPRESSION: Negative abdominal radiographs.  No acute cardiopulmonary disease. Electronically Signed   By: Tollie Ethavid  Kwon M.D.   On: 01/19/2018 01:58    EKG: Independently reviewed.  Normal sinus rhythm.  QTC 491 ms.  Assessment/Plan Principal Problem:   SIRS (systemic inflammatory response syndrome) (HCC) Active Problems:   Hypertension   Diabetes mellitus type 2 in nonobese (HCC)   CKD (chronic kidney disease) stage 3, GFR 30-59 ml/min (HCC)    1. SIRS -patient was hypothermic and lactate levels were high.  At this time the exact cause for the patient's unresponsive episode which patient does not exactly recall the whole incident is not sure but hypoglycemia could be 1 of the causes.  In addition we will be checking blood cultures UA urine cultures cortisol levels and at this time patient has been placed on empiric antibiotics.  Follow lactate levels and pro calcitonin levels.  Will check MRI brain EEG.  Since patient had a fall will check CK levels for any rhabdomyolysis and also troponin and 2D echo. 2. Epistaxis/hematemesis -patient was found to be having some blood around the nostrils and also had some vomitus which look like blood.  Has had no further episodes in the ER.  Closely observe.  Patient does not recall throwing up blood. 3. Hypertension -closely follow blood pressure trends.  Will keep patient on PRN IV hydralazine.  Continue home medications. 4. Diabetes mellitus type 2 -since patient blood sugar was running low will hold off long-acting insulin with sliding scale coverage only.  Patient is able to eat well and if sugars are remaining high we will restart long-acting insulin.  Check hemoglobin  A1c. 5. Chronic low back pain.  Addendum -it was noted that patient's lip swelling was becoming more prominent and also was complaining of some throat discomfort.  For which patient Zosyn was discontinued and patient is placed on Solu-Medrol Pepcid and Benadryl.  Suspect patient may be developing allergy to Zosyn.  Patient as per the pharmacy has had previous received vancomycin.  On my exam patient is not have any stridors or difficulty swallowing and no tongue swelling.  For now we will keep patient n.p.o. and  will keep his CBGs every 4 with sliding scale coverage and follow blood pressure will keep patient on PRN IV hydralazine for now until patient's possible angioedema results.  Check CT face maxillofacial.   DVT prophylaxis: SCDs.   Code Status: Full code. Family Communication: Discussed with patient. Disposition Plan: Home. Consults called: None. Admission status: Inpatient.   Eduard Clos MD [facility] Pager [phone].  If 7PM-7AM, please contact night-coverage [email or web address] Password [facility]  01/19/2018, 3:19 AM

## 2018-01-19 NOTE — Progress Notes (Signed)
Cheryl Carey  Cheryl Carey  ZOX:[id number] DOB: 13-Mar-1946 DOA: 01/19/2018 PCP: Mila PalmerWolters, Sharon, MD    Brief Narrative:  72 yo F with a hx of HTN, DM, and chronic low back pain who was not responding to calls after having an episode of hypoglycemia she attempted to correct at home.  Neighbors had to break in and found the pt awake but confused.  Blood sugar was 120 per EMS.   In the ER patient CT head and neck did not show anything acute except for thyroid nodules.  Acute abdominal series was unremarkable.  EKG noted NSR with QTc of 491ms.  Patient was found to be hypothermic with temperature in the 90s F.  Lactic acid was 5.  Blood cultures were obtained and patient was empirically started on vancomycin and Zosyn.  It was noted that patient had some swelling of the lower lip.  Subjective: Pt is seen for a f/u visit.    Assessment & Plan:  SIRS patient was hypothermic and lactate levels were high  Allergic reaction to Zosyn  Acute kidney injury   Recent Labs  Lab 01/19/18 0138 01/19/18 0146 01/19/18 0314  CREATININE 1.54* 1.40* 1.67*    Low grade rhabdomyolysis   Epistaxis / hematemesis patient was found to be having some blood around the nostrils and also had some vomitus which look liked blood - no evidence of ongoing bleeding at this time   Normocytic anemia   Recent Labs  Lab 01/19/18 0138 01/19/18 0146 01/19/18 0314  HGB 11.1* 11.6* 12.5    Thyroid nodules measuring up to 17 mm Will need thyroid US (can be done as outpt)  Hypertension  DM 2 A1c 7.9  Chronic low back pain   DVT prophylaxis: SCDs Code Status: FULL CODE Family Communication: no family present at time of exam  Disposition Plan:   Consultants:  none  Procedures: none  Antimicrobials:  Aztreonam 2/11 Zosyn 2/10  Vanc 2/10  Objective: Blood pressure (!) 162/67, pulse 98, temperature 98.9 F (37.2 C), temperature source Oral, resp. rate (!) 23,  height 5\' 1"  (1.549 m), weight 85 kg (187 lb 6.3 oz), SpO2 93 %.  Intake/Output Summary (Last 24 hours) at 01/19/2018 0942 Last data filed at 01/19/2018 0351 Gross per 24 hour  Intake 750 ml  Output -  Net 750 ml   Filed Weights   01/19/18 0300 01/19/18 0508  Weight: 82.1 kg (181 lb) 85 kg (187 lb 6.3 oz)    Examination: Pt was seen for a f/u visit.    CBC: Recent Labs  Lab 01/19/18 0138 01/19/18 0146 01/19/18 0314  WBC 8.5  --  13.6*  NEUTROABS 6.6  --   --   HGB 11.1* 11.6* 12.5  HCT 34.9* 34.0* 38.7  MCV 92.3  --  91.5  PLT 260  --  272   Basic Metabolic Panel: Recent Labs  Lab 01/19/18 0138 01/19/18 0146 01/19/18 0314  NA 141 143 137  K 3.2* 3.2* 4.2  CL 107 105 99*  CO2 20*  --  21*  GLUCOSE 122* 115* 176*  BUN 26* 26* 31*  CREATININE 1.54* 1.40* 1.67*  CALCIUM 8.2*  --  9.5  MG  --   --  2.0   GFR: Estimated Creatinine Clearance: 29.3 mL/min (A) (by C-G formula based on SCr of 1.67 mg/dL (H)).  Liver Function Tests: Recent Labs  Lab 01/19/18 0138 01/19/18 0314  AST 34 55*  ALT 19 25  ALKPHOS 69 80  BILITOT 0.5 0.8  PROT 5.8* 7.0  ALBUMIN 3.1* 3.6    Cardiac Enzymes: Recent Labs  Lab 01/19/18 0138 01/19/18 0314  CKTOTAL 630* 1,859*  CKMB 9.7*  --   TROPONINI  --  0.11*    HbA1C: Hemoglobin A1C  Date/Time Value Ref Range Status  08/07/2016 02:13 PM 7.6  Final   Hgb A1c MFr Bld  Date/Time Value Ref Range Status  01/19/2018 03:14 AM 7.9 (H) 4.8 - 5.6 % Final    Comment:    (NOTE) Pre diabetes:          5.7%-6.4% Diabetes:              >6.4% Glycemic control for   <7.0% adults with diabetes   06/17/2014 03:40 AM 9.7 (H) <5.7 % Final    Comment:    (NOTE)                                                                       According to the ADA Clinical Practice Recommendations for 2011, when HbA1c is used as a screening test:  >=6.5%   Diagnostic of Diabetes Mellitus           (if abnormal result is confirmed) 5.7-6.4%    Increased risk of developing Diabetes Mellitus References:Diagnosis and Classification of Diabetes Mellitus,Diabetes Care,2011,34(Suppl 1):S62-S69 and Standards of Medical Care in         Diabetes - 2011,Diabetes Care,2011,34 (Suppl 1):S11-S61.    CBG: Recent Labs  Lab 01/19/18 0123 01/19/18 0512  GLUCAP 123* 200*    Recent Results (from the past 240 hour(s))  MRSA PCR Screening     Status: None   Collection Time: 01/19/18  5:38 AM  Result Value Ref Range Status   MRSA by PCR NEGATIVE NEGATIVE Final    Comment:        The GeneXpert MRSA Assay (FDA approved for NASAL specimens only), is one component of a comprehensive MRSA colonization surveillance program. It is not intended to diagnose MRSA infection nor to guide or monitor treatment for MRSA infections. Performed at [facility], [address] [place], [place] [address]      Scheduled Meds: . diphenhydrAMINE      . insulin aspart  0-15 Units Subcutaneous Q4H  . latanoprost  1 drop Both Eyes QHS     LOS: 0 days   Time spent: No Charge  Lonia Blood, MD [facility] Office  [phone] Pager - Text Page per Amion as per below:  On-Call/Text Page:      [email or web address]      password [email or web address]  If 7PM-7AM, please contact night-coverage [email or web address] Password [email or web address] 01/19/2018, 9:42 AM

## 2018-01-19 NOTE — ED Notes (Signed)
Swelling noted to pt's lower lip upon arrival to ED.  Relayed this information to floor RN.

## 2018-01-19 NOTE — Progress Notes (Signed)
Pharmacy Antibiotic Note  Cheryl Carey is a 72 y.o. female admitted on 01/19/2018 with sepsis.  Pharmacy has been consulted for Vancocin and Zosyn dosing.  Plan: Vancomycin 1500mg  x1 then 1250mg  IV every 24 hours.  Goal trough 15-20 mcg/mL. Zosyn 3.375g IV q8h (4 hour infusion).   ADDENDUM: Pharmacy consulted to transition to Aztreonam due to possible allergic reaction to Zosyn. Noted, patient has tolerated cefazolin multiple for multiple doses here in the past. SCr 1.67 on admit, CrCl~30.  Plan: Continue vancomycin 1250mg  IV q24h D/c Zosyn > start aztreonam 1g IV q8h Monitor clinical progress, c/s, renal function F/u de-escalation plan/LOT, vancomycin trough as indicated   Height: 5\' 1"  (154.9 cm) Weight: 187 lb 6.3 oz (85 kg) IBW/kg (Calculated) : 47.8  Temp (24hrs), Avg:95.9 F (35.5 C), Min:90.1 F (32.3 C), Max:99.3 F (37.4 C)  Recent Labs  Lab 01/19/18 0138 01/19/18 0146 01/19/18 0314 01/19/18 0325 01/19/18 0552  WBC 8.5  --  13.6*  --   --   CREATININE 1.54* 1.40* 1.67*  --   --   LATICACIDVEN  --  5.97*  --  5.12* 3.7*    Estimated Creatinine Clearance: 29.3 mL/min (A) (by C-G formula based on SCr of 1.67 mg/dL (H)).    Allergies  Allergen Reactions  . Zosyn [Piperacillin Sod-Tazobactam So] Swelling    Lip swelling  . Codeine Nausea Only    Babs BertinHaley Norberto Wishon, PharmD, BCPS Clinical Pharmacist Clinical phone for 01/19/2018 until 3:30pm: [phone] If after 3:30pm, please call main pharmacy at: [phone] 01/19/2018 8:30 AM

## 2018-01-19 NOTE — ED Triage Notes (Signed)
Pt bib [facility] from home d/t fall. Per EMS, [employer] called out to residence for wellness check.  Upon [employer] arrival pt was found in a chair tipped over backwards covered in vomit.  Pt has no memory of the fall.  Estimated time pt was on the floor is 2 hours.  Pt is A&O x 4.

## 2018-01-19 NOTE — ED Provider Notes (Addendum)
[facility] EMERGENCY DEPARTMENT Provider Note   CSN: [id number] Arrival date & time: 01/19/18  0105     History   Chief Complaint Chief Complaint  Patient presents with  . Fall    HPI Cheryl Carey is a 72 y.o. female.  The history is provided by the EMS personnel. The history is limited by the condition of the patient.  Fall  This is a new problem. The current episode started 6 to 12 hours ago. The problem occurs constantly. The problem has not changed since onset.Pertinent negatives include no chest pain. Nothing aggravates the symptoms. Nothing relieves the symptoms. She has tried nothing for the symptoms. The treatment provided no relief.  Hadn't been seen in several hours by family.  EMS contacted.  Reportedly vomited and has crusting of blood around her nose.    Past Medical History:  Diagnosis Date  . Anemia    hx  . Arthritis   . Diabetes mellitus without complication (HCC)   . Headache   . Hypertension   . PONV (postoperative nausea and vomiting)   . Spondylolisthesis at L4-L5 level     Patient Active Problem List   Diagnosis Date Noted  . Spondylolisthesis at L4-L5 level 07/29/2016  . DKA (diabetic ketoacidoses) (HCC) 06/16/2014  . Hypertension 06/16/2014    Past Surgical History:  Procedure Laterality Date  . ABDOMINAL HYSTERECTOMY    . COLONOSCOPY    . DECOMPRESSIVE LAMINECTOMY AND FUSION L4-5  07/2016  . DILATION AND CURETTAGE OF UTERUS    . Right shoulder surgery      OB History    No data available       Home Medications    Prior to Admission medications   Medication Sig Start Date End Date Taking? Authorizing Provider  amLODipine (NORVASC) 10 MG tablet Take 10 mg by mouth daily.     [provider]  aspirin 81 MG tablet Take 81 mg by mouth daily.     [provider]  cyclobenzaprine (FLEXERIL) 5 MG tablet Take 5 mg by mouth 3 (three) times daily as needed for muscle spasms.    [provider]  ferrous sulfate 325 (65 FE) MG tablet Take 325 mg by mouth 2 (two) times daily with a meal.    [provider]  furosemide (LASIX) 20 MG tablet Take 20 mg by mouth daily.    [provider]  Insulin Degludec (TRESIBA FLEXTOUCH) 200 UNIT/ML SOPN Inject 20 Units into the skin daily.    [provider]  insulin lispro (HUMALOG) 100 UNIT/ML injection Inject 2-6 Units into the skin See admin instructions. 6 units before breakfast, 4 units before lunch, 2 units in evening    [provider]  lisinopril (PRINIVIL,ZESTRIL) 40 MG tablet Take 40 mg by mouth daily.    [provider]  metFORMIN (GLUCOPHAGE) 500 MG tablet Take 1,000 mg by mouth 2 (two) times daily with a meal.    [provider]  Multiple Vitamins-Minerals (MULTI COMPLETE PO) Take 1 tablet by mouth daily.     [provider]  oxyCODONE-acetaminophen (PERCOCET/ROXICET) 5-325 MG tablet Take 1 tablet by mouth every 4 (four) hours as needed for moderate pain. 08/02/16   Donalee Citrin, MD    Family History No family history on file.  Social History Social History   Tobacco Use  . Smoking status: Never Smoker  . Smokeless tobacco: Never Used  Substance Use Topics  . Alcohol use: No  .  Drug use: No     Allergies   Codeine   Review of Systems Review of Systems  Unable to perform ROS: Mental status change  HENT: Positive for nosebleeds.   Cardiovascular: Negative for chest pain.  Gastrointestinal: Positive for vomiting.     Physical Exam Updated Vital Signs BP 124/84 (BP Location: Right Arm)   Pulse 80   Temp (!) 90.1 F (32.3 C) (Rectal)   Resp (!) 28   SpO2 100%   Physical Exam  Constitutional: She appears well-developed and well-nourished. No distress.  HENT:  Head: Normocephalic and atraumatic.  Right Ear: External ear normal.  Left Ear: External ear normal.  Mouth/Throat: Oropharynx is clear and moist.  Crusting of blood around right nare.  No  swelling of the oropharynx,  Blood into hair without laceration  Eyes: Conjunctivae are normal. Pupils are equal, round, and reactive to light.  Neck: Normal range of motion. Neck supple. No JVD present. No tracheal deviation present.  Cardiovascular: Normal rate, regular rhythm, normal heart sounds and intact distal pulses.  Pulmonary/Chest: Effort normal and breath sounds normal. No stridor. No respiratory distress. She has no wheezes. She has no rales.  Abdominal: Soft. She exhibits distension. She exhibits no mass. There is no tenderness. There is no rebound and no guarding.  Tinkling bowel sounds  Musculoskeletal: Normal range of motion. She exhibits no edema.  Neurological: She is alert. She displays normal reflexes.  Skin: Skin is dry. Capillary refill takes less than 2 seconds.  Psychiatric: She has a normal mood and affect.     ED Treatments / Results  Labs (all labs ordered are listed, but only abnormal results are displayed)  Results for orders placed or performed during the hospital encounter of 01/19/18  CBC with Differential/Platelet  Result Value Ref Range   WBC 8.5 4.0 - 10.5 K/uL   RBC 3.78 (L) 3.87 - 5.11 MIL/uL   Hemoglobin 11.1 (L) 12.0 - 15.0 g/dL   HCT 16.134.9 (L) 09.636.0 - 04.546.0 %   MCV 92.3 78.0 - 100.0 fL   MCH 29.4 26.0 - 34.0 pg   MCHC 31.8 30.0 - 36.0 g/dL   RDW 40.913.2 81.111.5 - 91.415.5 %   Platelets 260 150 - 400 K/uL   Neutrophils Relative % 79 %   Neutro Abs 6.6 1.7 - 7.7 K/uL   Lymphocytes Relative 18 %   Lymphs Abs 1.5 0.7 - 4.0 K/uL   Monocytes Relative 3 %   Monocytes Absolute 0.3 0.1 - 1.0 K/uL   Eosinophils Relative 0 %   Eosinophils Absolute 0.0 0.0 - 0.7 K/uL   Basophils Relative 0 %   Basophils Absolute 0.0 0.0 - 0.1 K/uL  Comprehensive metabolic panel  Result Value Ref Range   Sodium 141 135 - 145 mmol/L   Potassium 3.2 (L) 3.5 - 5.1 mmol/L   Chloride 107 101 - 111 mmol/L   CO2 20 (L) 22 - 32 mmol/L   Glucose, Bld 122 (H) 65 - 99 mg/dL   BUN 26  (H) 6 - 20 mg/dL   Creatinine, Ser 7.821.54 (H) 0.44 - 1.00 mg/dL   Calcium 8.2 (L) 8.9 - 10.3 mg/dL   Total Protein 5.8 (L) 6.5 - 8.1 g/dL   Albumin 3.1 (L) 3.5 - 5.0 g/dL   AST 34 15 - 41 U/L   ALT 19 14 - 54 U/L   Alkaline Phosphatase 69 38 - 126 U/L   Total Bilirubin 0.5 0.3 - 1.2 mg/dL   GFR calc  non Af Amer 32 (L) >60 mL/min   GFR calc Af Amer 37 (L) >60 mL/min   Anion gap 14 5 - 15  TSH  Result Value Ref Range   TSH 1.503 0.350 - 4.500 uIU/mL  CK total and CKMB (cardiac)not at [facility]  Result Value Ref Range   Total CK 630 (H) 38 - 234 U/L   CK, MB 9.7 (H) 0.5 - 5.0 ng/mL   Relative Index 1.5 0.0 - 2.5  Basic metabolic panel  Result Value Ref Range   Sodium 137 135 - 145 mmol/L   Potassium 4.2 3.5 - 5.1 mmol/L   Chloride 99 (L) 101 - 111 mmol/L   CO2 21 (L) 22 - 32 mmol/L   Glucose, Bld 176 (H) 65 - 99 mg/dL   BUN 31 (H) 6 - 20 mg/dL   Creatinine, Ser 5.28 (H) 0.44 - 1.00 mg/dL   Calcium 9.5 8.9 - 41.3 mg/dL   GFR calc non Af Amer 29 (L) >60 mL/min   GFR calc Af Amer 34 (L) >60 mL/min   Anion gap 17 (H) 5 - 15  CBC  Result Value Ref Range   WBC 13.6 (H) 4.0 - 10.5 K/uL   RBC 4.23 3.87 - 5.11 MIL/uL   Hemoglobin 12.5 12.0 - 15.0 g/dL   HCT 24.4 01.0 - 27.2 %   MCV 91.5 78.0 - 100.0 fL   MCH 29.6 26.0 - 34.0 pg   MCHC 32.3 30.0 - 36.0 g/dL   RDW 53.6 64.4 - 03.4 %   Platelets 272 150 - 400 K/uL  Hepatic function panel  Result Value Ref Range   Total Protein 7.0 6.5 - 8.1 g/dL   Albumin 3.6 3.5 - 5.0 g/dL   AST 55 (H) 15 - 41 U/L   ALT 25 14 - 54 U/L   Alkaline Phosphatase 80 38 - 126 U/L   Total Bilirubin 0.8 0.3 - 1.2 mg/dL   Bilirubin, Direct <7.4 (L) 0.1 - 0.5 mg/dL   Indirect Bilirubin NOT CALCULATED 0.3 - 0.9 mg/dL  Troponin I  Result Value Ref Range   Troponin I 0.11 (HH) <0.03 ng/mL  Magnesium  Result Value Ref Range   Magnesium 2.0 1.7 - 2.4 mg/dL  Procalcitonin  Result Value Ref Range   Procalcitonin <0.10 ng/mL  CK  Result Value Ref Range    Total CK 1,859 (H) 38 - 234 U/L  Hemoglobin A1c  Result Value Ref Range   Hgb A1c MFr Bld 7.9 (H) 4.8 - 5.6 %   Mean Plasma Glucose 180.03 mg/dL  Cortisol  Result Value Ref Range   Cortisol, Plasma 39.4 ug/dL  Glucose, capillary  Result Value Ref Range   Glucose-Capillary 200 (H) 65 - 99 mg/dL  POC CBG, ED  Result Value Ref Range   Glucose-Capillary 123 (H) 65 - 99 mg/dL  I-Stat Chem 8, ED  Result Value Ref Range   Sodium 143 135 - 145 mmol/L   Potassium 3.2 (L) 3.5 - 5.1 mmol/L   Chloride 105 101 - 111 mmol/L   BUN 26 (H) 6 - 20 mg/dL   Creatinine, Ser 2.59 (H) 0.44 - 1.00 mg/dL   Glucose, Bld 563 (H) 65 - 99 mg/dL   Calcium, Ion 8.75 (L) 1.15 - 1.40 mmol/L   TCO2 22 22 - 32 mmol/L   Hemoglobin 11.6 (L) 12.0 - 15.0 g/dL   HCT 64.3 (L) 32.9 - 51.8 %  I-stat troponin, ED  Result Value Ref Range   Troponin  i, poc 0.04 0.00 - 0.08 ng/mL   Comment 3          I-Stat CG4 Lactic Acid, ED  (not at  [facility])  Result Value Ref Range   Lactic Acid, Venous 5.97 (HH) 0.5 - 1.9 mmol/L   Comment NOTIFIED PHYSICIAN   I-Stat CG4 Lactic Acid, ED  (not at  [facility])  Result Value Ref Range   Lactic Acid, Venous 5.12 (HH) 0.5 - 1.9 mmol/L   Comment NOTIFIED PHYSICIAN    Ct Head Wo Contrast  Result Date: 01/19/2018 CLINICAL DATA:  72 y/o  F; found on floor covered in vomitus. EXAM: CT HEAD WITHOUT CONTRAST CT CERVICAL SPINE WITHOUT CONTRAST TECHNIQUE: Multidetector CT imaging of the head and cervical spine was performed following the standard protocol without intravenous contrast. Multiplanar CT image reconstructions of the cervical spine were also generated. COMPARISON:  None. FINDINGS: CT HEAD FINDINGS Brain: No evidence of acute infarction, hemorrhage, hydrocephalus, extra-axial collection or mass lesion/mass effect. Mild chronic microvascular ischemic changes and parenchymal volume loss of the brain. Vascular: Calcific atherosclerosis of carotid siphons. No hyperdense vessel. Skull: Normal. Negative  for fracture or focal lesion. Sinuses/Orbits: No acute finding. Other: None. CT CERVICAL SPINE FINDINGS Alignment: Straightening of cervical lordosis without listhesis. Skull base and vertebrae: No acute fracture. No primary bone lesion or focal pathologic process. Soft tissues and spinal canal: No prevertebral fluid or swelling. No visible canal hematoma. Disc levels: Moderate cervical spondylosis with predominant discogenic degenerative changes. Multilevel disc space narrowing and marginal osteophytes. Uncovertebral hypertrophy encroaches on the left C3-4, bilateral C4-5, right C5-6, and bilateral C6-7 neural foramen. No high-grade bony canal stenosis. Upper chest: Negative. Other: Multiple thyroid nodules measuring up to 17 mm in the left lobe. Carotid and aortic calcific atherosclerosis. IMPRESSION: 1. No acute intracranial abnormality or calvarial fracture. 2. No acute fracture or dislocation of cervical spine. 3. Mild chronic microvascular ischemic changes and parenchymal volume loss of the brain. 4. Moderate cervical spondylosis with predominant discogenic degenerative changes. 5. Thyroid nodules measuring up to 17 mm. Thyroid ultrasound is recommended on a nonemergent basis for further evaluation. Electronically Signed   By: Mitzi Hansen M.D.   On: 01/19/2018 02:22   Ct Cervical Spine Wo Contrast  Result Date: 01/19/2018 CLINICAL DATA:  72 y/o  F; found on floor covered in vomitus. EXAM: CT HEAD WITHOUT CONTRAST CT CERVICAL SPINE WITHOUT CONTRAST TECHNIQUE: Multidetector CT imaging of the head and cervical spine was performed following the standard protocol without intravenous contrast. Multiplanar CT image reconstructions of the cervical spine were also generated. COMPARISON:  None. FINDINGS: CT HEAD FINDINGS Brain: No evidence of acute infarction, hemorrhage, hydrocephalus, extra-axial collection or mass lesion/mass effect. Mild chronic microvascular ischemic changes and parenchymal volume  loss of the brain. Vascular: Calcific atherosclerosis of carotid siphons. No hyperdense vessel. Skull: Normal. Negative for fracture or focal lesion. Sinuses/Orbits: No acute finding. Other: None. CT CERVICAL SPINE FINDINGS Alignment: Straightening of cervical lordosis without listhesis. Skull base and vertebrae: No acute fracture. No primary bone lesion or focal pathologic process. Soft tissues and spinal canal: No prevertebral fluid or swelling. No visible canal hematoma. Disc levels: Moderate cervical spondylosis with predominant discogenic degenerative changes. Multilevel disc space narrowing and marginal osteophytes. Uncovertebral hypertrophy encroaches on the left C3-4, bilateral C4-5, right C5-6, and bilateral C6-7 neural foramen. No high-grade bony canal stenosis. Upper chest: Negative. Other: Multiple thyroid nodules measuring up to 17 mm in the left lobe. Carotid and aortic calcific atherosclerosis. IMPRESSION: 1. No  acute intracranial abnormality or calvarial fracture. 2. No acute fracture or dislocation of cervical spine. 3. Mild chronic microvascular ischemic changes and parenchymal volume loss of the brain. 4. Moderate cervical spondylosis with predominant discogenic degenerative changes. 5. Thyroid nodules measuring up to 17 mm. Thyroid ultrasound is recommended on a nonemergent basis for further evaluation. Electronically Signed   By: Mitzi Hansen M.D.   On: 01/19/2018 02:22   Dg Pelvis Portable  Result Date: 01/19/2018 CLINICAL DATA:  Low back pain after fall. EXAM: PORTABLE PELVIS 1-2 VIEWS COMPARISON:  Lumbar spine 07/29/2016. FINDINGS: Hardware from L4-5 PLIF procedure is redemonstrated. Bony pelvis and proximal femora appear intact without acute fracture nor joint dislocation. No pelvic diastasis. IMPRESSION: No acute osseous abnormality. Electronically Signed   By: Tollie Eth M.D.   On: 01/19/2018 03:37   Dg Abdomen Acute W/chest  Result Date: 01/19/2018 CLINICAL DATA:   Pain after fall EXAM: DG ABDOMEN ACUTE W/ 1V CHEST COMPARISON:  05/06/2017 FINDINGS: There is no evidence of dilated bowel loops or free intraperitoneal air. Nonobstructed, nondistended bowel gas pattern. Redemonstration of bilateral pedicle screw fixation at L4 and L5. Single screw fixation of the right glenoid. Heart size and mediastinal contours are within normal limits. Both lungs are clear. IMPRESSION: Negative abdominal radiographs.  No acute cardiopulmonary disease. Electronically Signed   By: Tollie Eth M.D.   On: 01/19/2018 01:58     Procedures Procedures (including critical care time)  Medications Ordered in ED Medications  vancomycin (VANCOCIN) IVPB 1000 mg/200 mL premix (not administered)  piperacillin-tazobactam (ZOSYN) IVPB 3.375 g (not administered)   MDM Reviewed: previous chart, nursing note and vitals Interpretation: labs, ECG, x-ray and CT scan (elevated lactate no elevation of white count, elevated CK, No PNA on CXR , no bleed on CT by me) Total time providing critical care: 75-105 minutes. This excludes time spent performing separately reportable procedures and services. Consults: admitting MD   CRITICAL CARE Performed by: Jasmine Awe Total critical care time: 75 minutes Critical care time was exclusive of separately billable procedures and treating other patients. Critical care was necessary to treat or prevent imminent or life-threatening deterioration. Critical care was time spent personally by me on the following activities: development of treatment plan with patient and/or surrogate as well as nursing, discussions with consultants, evaluation of patient's response to treatment, examination of patient, obtaining history from patient or surrogate, ordering and performing treatments and interventions, ordering and review of laboratory studies, ordering and review of radiographic studies, pulse oximetry and re-evaluation of patient's condition.  Initial  Impression / Assessment and Plan / ED Course    Patient is on Lasix and I feel that 30 cc/kg bolus especially in light of the fact that the patient is normotensive would cause the patient to go into volume overload.  Per Dr. Toniann Fail he wants a 500 cc bolus, will place this order in computer.  I suspect lactate is from being down and from hypothermia.  CK added  Immediately started bear hugger and IV antibiotics per sepsis protocol.     Final Clinical Impressions(s) / ED Diagnoses   Will admit to medicine for AMS and hypothermia and sepsis picture for unknown source.     Sue Fernicola, MD 01/19/18 1610    Cy Blamer, MD 01/19/18 Kizzie Bane, Veleta Yamamoto, MD 01/19/18 [id number]

## 2018-01-19 NOTE — ED Notes (Signed)
Delay in 2nd set blood culture,  Pt not in room at this time.

## 2018-01-20 ENCOUNTER — Inpatient Hospital Stay (HOSPITAL_COMMUNITY): Payer: Medicare Other

## 2018-01-20 DIAGNOSIS — I361 Nonrheumatic tricuspid (valve) insufficiency: Secondary | ICD-10-CM

## 2018-01-20 LAB — COMPREHENSIVE METABOLIC PANEL
ALT: 41 U/L (ref 14–54)
ANION GAP: 10 (ref 5–15)
AST: 99 U/L — ABNORMAL HIGH (ref 15–41)
Albumin: 3.2 g/dL — ABNORMAL LOW (ref 3.5–5.0)
Alkaline Phosphatase: 71 U/L (ref 38–126)
BUN: 25 mg/dL — ABNORMAL HIGH (ref 6–20)
CALCIUM: 8.6 mg/dL — AB (ref 8.9–10.3)
CHLORIDE: 107 mmol/L (ref 101–111)
CO2: 22 mmol/L (ref 22–32)
Creatinine, Ser: 1.33 mg/dL — ABNORMAL HIGH (ref 0.44–1.00)
GFR calc non Af Amer: 38 mL/min — ABNORMAL LOW (ref >60)
GFR, EST AFRICAN AMERICAN: 44 mL/min — AB (ref >60)
GLUCOSE: 351 mg/dL — AB (ref 65–99)
POTASSIUM: 4.3 mmol/L (ref 3.5–5.1)
SODIUM: 139 mmol/L (ref 135–145)
Total Bilirubin: 0.9 mg/dL (ref 0.3–1.2)
Total Protein: 5.9 g/dL — ABNORMAL LOW (ref 6.5–8.1)

## 2018-01-20 LAB — ECHOCARDIOGRAM COMPLETE
CHL CUP RV SYS PRESS: 32 mmHg
CHL CUP TV REG PEAK VELOCITY: 271 cm/s
E decel time: 203 msec
EERAT: 7.96
FS: 44 % (ref 28–44)
Height: 61 in
IVS/LV PW RATIO, ED: 1.13
LA ID, A-P, ES: 33 mm
LA diam end sys: 33 mm
LA vol index: 29.9 mL/m2
LA vol: 54.7 mL
LADIAMINDEX: 1.8 cm/m2
LAVOLA4C: 54.5 mL
LV E/e' medial: 7.96
LV E/e'average: 7.96
LV TDI E'LATERAL: 10
LV e' LATERAL: 10 cm/s
LVOT VTI: 29.2 cm
LVOT area: 3.14 cm2
LVOT peak grad rest: 8 mmHg
LVOT peak vel: 141 cm/s
LVOTD: 20 mm
LVOTSV: 92 mL
MV Dec: 203
MV pk A vel: 121 m/s
MVPG: 3 mmHg
MVPKEVEL: 79.6 m/s
PW: 8.69 mm — AB (ref 0.6–1.1)
RV LATERAL S' VELOCITY: 20.5 cm/s
RV TAPSE: 26 mm
TDI e' medial: 8.92
TRMAXVEL: 271 cm/s
WEIGHTICAEL: 2959.46 [oz_av]

## 2018-01-20 LAB — GLUCOSE, CAPILLARY
GLUCOSE-CAPILLARY: 312 mg/dL — AB (ref 65–99)
GLUCOSE-CAPILLARY: 202 mg/dL — AB (ref 65–99)
Glucose-Capillary: 293 mg/dL — ABNORMAL HIGH (ref 65–99)
Glucose-Capillary: 241 mg/dL — ABNORMAL HIGH (ref 65–99)
Glucose-Capillary: 292 mg/dL — ABNORMAL HIGH (ref 65–99)

## 2018-01-20 LAB — CK: CK TOTAL: 3523 U/L — AB (ref 38–234)

## 2018-01-20 LAB — CBC
HEMATOCRIT: 35.8 % — AB (ref 36.0–46.0)
HEMOGLOBIN: 11.2 g/dL — AB (ref 12.0–15.0)
MCH: 28.6 pg (ref 26.0–34.0)
MCHC: 31.3 g/dL (ref 30.0–36.0)
MCV: 91.3 fL (ref 78.0–100.0)
Platelets: 272 10*3/uL (ref 150–400)
RBC: 3.92 MIL/uL (ref 3.87–5.11)
RDW: 13.8 % (ref 11.5–15.5)
WBC: 15.1 10*3/uL — ABNORMAL HIGH (ref 4.0–10.5)

## 2018-01-20 LAB — LACTIC ACID, PLASMA: Lactic Acid, Venous: 1 mmol/L (ref 0.5–1.9)

## 2018-01-20 LAB — TROPONIN I: Troponin I: 0.09 ng/mL (ref <0.03)

## 2018-01-20 MED ORDER — SODIUM CHLORIDE 0.9 % IV SOLN
INTRAVENOUS | Status: DC
  Administered 2018-01-20: 12:00:00 via INTRAVENOUS

## 2018-01-20 MED ORDER — INSULIN GLARGINE 100 UNIT/ML ~~LOC~~ SOLN
18.0000 [IU] | Freq: Every day | SUBCUTANEOUS | Status: DC
  Administered 2018-01-21 – 2018-01-22 (×2): 18 [IU] via SUBCUTANEOUS
  Filled 2018-01-20 (×4): qty 0.18

## 2018-01-20 MED ORDER — INSULIN GLARGINE 100 UNIT/ML ~~LOC~~ SOLN
16.0000 [IU] | Freq: Every day | SUBCUTANEOUS | Status: DC
  Filled 2018-01-20: qty 0.16

## 2018-01-20 MED ORDER — INSULIN DEGLUDEC 200 UNIT/ML ~~LOC~~ SOPN: 20.0000 [IU] | PEN_INJECTOR | Freq: Every day | SUBCUTANEOUS | Status: DC

## 2018-01-20 MED ORDER — ASPIRIN 81 MG PO CHEW
81.0000 mg | CHEWABLE_TABLET | Freq: Every day | ORAL | Status: DC
  Administered 2018-01-20 – 2018-01-24 (×5): 81 mg via ORAL
  Filled 2018-01-20 (×5): qty 1

## 2018-01-20 MED ORDER — INSULIN GLARGINE 100 UNIT/ML ~~LOC~~ SOLN
14.0000 [IU] | Freq: Every day | SUBCUTANEOUS | Status: DC
  Administered 2018-01-20: 14 [IU] via SUBCUTANEOUS
  Filled 2018-01-20: qty 0.14

## 2018-01-20 MED ORDER — AMLODIPINE BESYLATE 10 MG PO TABS
10.0000 mg | ORAL_TABLET | Freq: Every day | ORAL | Status: DC
  Administered 2018-01-20 – 2018-01-24 (×5): 10 mg via ORAL
  Filled 2018-01-20 (×5): qty 1

## 2018-01-20 MED ORDER — HEPARIN SODIUM (PORCINE) 5000 UNIT/ML IJ SOLN
5000.0000 [IU] | Freq: Three times a day (TID) | INTRAMUSCULAR | Status: DC
  Administered 2018-01-20 – 2018-01-24 (×12): 5000 [IU] via SUBCUTANEOUS
  Filled 2018-01-20 (×12): qty 1

## 2018-01-20 NOTE — Progress Notes (Signed)
  Echocardiogram 2D Echocardiogram has been performed.  Delcie RochENNINGTON, Lanaysia Fritchman 01/20/2018, 9:13 AM

## 2018-01-20 NOTE — Progress Notes (Signed)
Pt refused to receive sliding scale insulin for lunch stating "the sliding scale insulin thing never works for me I would like to continue taking my home dose of insulin of 4 units at lunch time'" Pt's blood sugar has remained elevated this hospitalization. Pt educated on the need for proper blood sugar control. Will re-educate and continue to monitor.

## 2018-01-20 NOTE — Progress Notes (Signed)
Inpatient Diabetes Program Recommendations  AACE/ADA: New Consensus Statement on Inpatient Glycemic Control (2015)  Target Ranges:  Prepandial:   less than 140 mg/dL      Peak postprandial:   less than 180 mg/dL (1-2 hours)      Critically ill patients:  140 - 180 mg/dL   Lab Results  Component Value Date   GLUCAP 241 (H) 01/20/2018   HGBA1C 7.9 (H) 01/19/2018    Review of Glycemic Control Results for Cheryl BaldingRESSLEY, Cambree A (MRN [id number]) as of 01/20/2018 15:23  Ref. Range 01/19/2018 16:32 01/19/2018 21:06 01/20/2018 02:42 01/20/2018 08:18 01/20/2018 12:21  Glucose-Capillary Latest Ref Range: 65 - 99 mg/dL 409209 (H) 811398 (H) 914293 (H) 312 (H) 241 (H)   Diabetes history: DM2 Outpatient Diabetes medications: Tresiba 12 units + Humalog 6-4-2 meal coverage tid + Metformin 1 gm bid Current orders for Inpatient glycemic control: Lantus 14 units + Novolog meal coverage 6-4-2 tid if eats 50% + Novolog sensitive correction tid  Inpatient Diabetes Program Recommendations:   Spoke with patient @ bedside. Patient states she takes her Humalog a few minutes prior to each meal as prescribed and Tresiba once daily. Patient sees Dr. Talmage NapBalan regarding her diabetes management and takes her meter with her to review CBGs on appointments. Reviewed with patient need to not take Humalog is she doesn't eat and report low CBGs and cover with 15 gms carb and recheck CBGs. Spoke with RN Hazel SamsWhiney Davis and patient is refusing to take correction along with meal coverage. Will continue to educate patient need to correct elevated CBGs.  Thank you, Billy FischerJudy E. Varie Machamer, RN, MSN, CDE  Diabetes Coordinator Inpatient Glycemic Control Team Team Pager [phone] (8am-5pm) 01/20/2018 3:28 PM

## 2018-01-20 NOTE — Progress Notes (Addendum)
[facility] TEAM 1 - Stepdown/ICU TEAM  Cheryl Carey  ZOX:[id number] DOB: 03/10/1946 DOA: 01/19/2018 PCP: Cheryl Carey, Sharon, MD    Brief Narrative:  72 yo F with a hx of HTN, DM, and chronic low back pain who was not responding to calls after having an episode of hypoglycemia she attempted to correct at home.  Neighbors had to break in and found the pt awake but confused.  Blood sugar was 120 per EMS.   In the ER patient CT head and neck did not show anything acute except for thyroid nodules.  Acute abdominal series was unremarkable.  EKG noted NSR with QTc of 491ms.  Patient was found to be hypothermic with temperature in the 90s F.  Lactic acid was 5.  Blood cultures were obtained and patient was empirically started on vancomycin and Zosyn.  It was noted that patient had some swelling of the lower lip.  Significant Events: 2/11 admit 2/11 EEG - no seizure activity  2/12 TTE - pending   Subjective: Pt is very worried about her climbing CBG.  I have had a lengthy discussion w/ her regarding her initial hypoglycemia and the fact that we can not use her exact home DM regimen in the current clinical situation.  She c/o some back muscle spasms, but denies cp, sob, n/v, or abdom pain.    Assessment & Plan:  SIRS patient was hypothermic and lactate levels were high - this was likey a response to severe hypoglycemia and rhabdo - no focal signs to suggest an infection - lactate has normalized - follow off abx   DM 2 w/ hypoglycemia  A1c 7.9 - hypoglycemia felt to be etiology of AMS/unresponsive episode - CBG now elevated/poorly controlled - resuming home regimen w/ lantus (in place of [place]) - pt states she takes 12U of [place], NOT 20 as reported in the chart   Rhabdomyolysis CK still climbing - cont to hydrate - renal fxn is improving   Mildly elevated troponin No chest pain - EKG w/o acute findings - likely due to rhabdo/acute stress - not suggestive of true ACS   Allergic reaction to  Zosyn Quickly resolved w/ steroids and antihistamines  Acute kidney injury   Recent Labs  Lab 01/19/18 0138 01/19/18 0146 01/19/18 0314 01/20/18 0758  CREATININE 1.54* 1.40* 1.67* 1.33*    Epistaxis / hematemesis patient was found to be having some blood around the nostrils and also had some vomitus which look liked blood - no evidence of ongoing bleeding at this time   Normocytic anemia  No evidence of signif blood loss - follow   Recent Labs  Lab 01/19/18 0138 01/19/18 0146 01/19/18 0314 01/20/18 0758  HGB 11.1* 11.6* 12.5 11.2*    Thyroid nodules measuring up to 17 mm Will need thyroid US (can be done as outpt) - pt is followed by an Endocrinologist (Cheryl Carey)  Hypertension BP trending upward - resume Norvasc - hold ACEi for now due to AKI   Chronic low back pain Cont prn flexeril  DVT prophylaxis: SCDs + SQ heparin  Code Status: FULL CODE Family Communication: no family present at time of exam  Disposition Plan: transfer to medical bed - ambulate - home when CK trending down, CBG controlled, and renal fxn normalized   Consultants:  none  Procedures: none  Antimicrobials:  Aztreonam 2/11 Zosyn 2/10  Vanc 2/10  Objective: Blood pressure (!) 154/67, pulse 96, temperature 99.7 F (37.6 C), temperature source Oral, resp. rate 20, height 5'  1" (1.549 m), weight 83.9 kg (184 lb 15.5 oz), SpO2 94 %.  Intake/Output Summary (Last 24 hours) at 01/20/2018 0930 Last data filed at 01/19/2018 2255 Gross per 24 hour  Intake -  Output 400 ml  Net -400 ml   Filed Weights   01/19/18 0300 01/19/18 0508 01/20/18 0500  Weight: 82.1 kg (181 lb) 85 kg (187 lb 6.3 oz) 83.9 kg (184 lb 15.5 oz)    Examination: General: No acute respiratory distress Lungs: Clear to auscultation bilaterally without wheezes or crackles Cardiovascular: Regular rate and rhythm without murmur gallop or rub normal S1 and S2 Abdomen: Nontender, nondistended, soft, bowel sounds positive, no  rebound, no ascites, no appreciable mass Extremities: No significant cyanosis, clubbing, or edema bilateral lower extremities   CBC: Recent Labs  Lab 01/19/18 0138 01/19/18 0146 01/19/18 0314 01/20/18 0758  WBC 8.5  --  13.6* 15.1*  NEUTROABS 6.6  --   --   --   HGB 11.1* 11.6* 12.5 11.2*  HCT 34.9* 34.0* 38.7 35.8*  MCV 92.3  --  91.5 91.3  PLT 260  --  272 272   Basic Metabolic Panel: Recent Labs  Lab 01/19/18 0138 01/19/18 0146 01/19/18 0314 01/20/18 0758  NA 141 143 137 139  K 3.2* 3.2* 4.2 4.3  CL 107 105 99* 107  CO2 20*  --  21* 22  GLUCOSE 122* 115* 176* 351*  BUN 26* 26* 31* 25*  CREATININE 1.54* 1.40* 1.67* 1.33*  CALCIUM 8.2*  --  9.5 8.6*  MG  --   --  2.0  --    GFR: Estimated Creatinine Clearance: 36.4 mL/min (A) (by C-G formula based on SCr of 1.33 mg/dL (H)).  Liver Function Tests: Recent Labs  Lab 01/19/18 0138 01/19/18 0314 01/20/18 0758  AST 34 55* 99*  ALT 19 25 41  ALKPHOS 69 80 71  BILITOT 0.5 0.8 0.9  PROT 5.8* 7.0 5.9*  ALBUMIN 3.1* 3.6 3.2*    Cardiac Enzymes: Recent Labs  Lab 01/19/18 0138 01/19/18 0314 01/19/18 0852 01/19/18 1521 01/20/18 0758  CKTOTAL 630* 1,859*  --   --  3,523*  CKMB 9.7*  --   --   --   --   TROPONINI  --  0.11* 0.19* 0.15* 0.09*    HbA1C: Hemoglobin A1C  Date/Time Value Ref Range Status  08/07/2016 02:13 PM 7.6  Final   Hgb A1c MFr Bld  Date/Time Value Ref Range Status  01/19/2018 03:14 AM 7.9 (H) 4.8 - 5.6 % Final    Comment:    (NOTE) Pre diabetes:          5.7%-6.4% Diabetes:              >6.4% Glycemic control for   <7.0% adults with diabetes   06/17/2014 03:40 AM 9.7 (H) <5.7 % Final    Comment:    (NOTE)                                                                       According to the ADA Clinical Practice Recommendations for 2011, when HbA1c is used as a screening test:  >=6.5%   Diagnostic of Diabetes Mellitus           (  if abnormal result is confirmed) 5.7-6.4%    Increased risk of developing Diabetes Mellitus References:Diagnosis and Classification of Diabetes Mellitus,Diabetes Care,2011,34(Suppl 1):S62-S69 and Standards of Medical Care in         Diabetes - 2011,Diabetes Care,2011,34 (Suppl 1):S11-S61.    CBG: Recent Labs  Lab 01/19/18 1212 01/19/18 1632 01/19/18 2106 01/20/18 0242 01/20/18 0818  GLUCAP 175* 209* 398* 293* 312*    Recent Results (from the past 240 hour(s))  Blood Culture (routine x 2)     Status: None (Preliminary result)   Collection Time: 01/19/18  1:35 AM  Result Value Ref Range Status   Specimen Description BLOOD LEFT ANTECUBITAL  Final   Special Requests   Final    BOTTLES DRAWN AEROBIC AND ANAEROBIC Blood Culture adequate volume   Culture   Final    NO GROWTH 1 DAY Performed at [facility], [address], [place], [place] [address]    Report Status PENDING  Incomplete  Blood Culture (routine x 2)     Status: None (Preliminary result)   Collection Time: 01/19/18  2:38 AM  Result Value Ref Range Status   Specimen Description BLOOD RIGHT ANTECUBITAL  Final   Special Requests   Final    BOTTLES DRAWN AEROBIC AND ANAEROBIC Blood Culture adequate volume   Culture   Final    NO GROWTH 1 DAY Performed at [facility], [address] [place], [place] [address]    Report Status PENDING  Incomplete  MRSA PCR Screening     Status: None   Collection Time: 01/19/18  5:38 AM  Result Value Ref Range Status   MRSA by PCR NEGATIVE NEGATIVE Final    Comment:        The GeneXpert MRSA Assay (FDA approved for NASAL specimens only), is one component of a comprehensive MRSA colonization surveillance program. It is not intended to diagnose MRSA infection nor to guide or monitor treatment for MRSA infections. Performed at [facility], [address] [place], [place] [address]      Scheduled Meds: . insulin aspart  0-9 Units Subcutaneous TID WC  . insulin aspart  6 Units Subcutaneous Q breakfast    And  . insulin aspart  4 Units Subcutaneous Q lunch   And  . insulin aspart  2 Units Subcutaneous Q supper  . insulin glargine  10 Units Subcutaneous QHS  . latanoprost  1 drop Both Eyes QHS     LOS: 1 day    Lonia Blood, MD [facility] Office  [phone] Pager - Text Page per Amion as per below:  On-Call/Text Page:      [email or web address]      password [email or web address]  If 7PM-7AM, please contact night-coverage [email or web address] Password [email or web address] 01/20/2018, 9:30 AM

## 2018-01-21 DIAGNOSIS — D649 Anemia, unspecified: Secondary | ICD-10-CM

## 2018-01-21 DIAGNOSIS — M6282 Rhabdomyolysis: Secondary | ICD-10-CM

## 2018-01-21 DIAGNOSIS — R040 Epistaxis: Secondary | ICD-10-CM

## 2018-01-21 DIAGNOSIS — M5440 Lumbago with sciatica, unspecified side: Secondary | ICD-10-CM

## 2018-01-21 DIAGNOSIS — E041 Nontoxic single thyroid nodule: Secondary | ICD-10-CM

## 2018-01-21 DIAGNOSIS — E1165 Type 2 diabetes mellitus with hyperglycemia: Secondary | ICD-10-CM

## 2018-01-21 DIAGNOSIS — E11649 Type 2 diabetes mellitus with hypoglycemia without coma: Principal | ICD-10-CM

## 2018-01-21 DIAGNOSIS — G8929 Other chronic pain: Secondary | ICD-10-CM

## 2018-01-21 DIAGNOSIS — E118 Type 2 diabetes mellitus with unspecified complications: Secondary | ICD-10-CM

## 2018-01-21 LAB — COMPREHENSIVE METABOLIC PANEL
ALT: 46 U/L (ref 14–54)
AST: 86 U/L — AB (ref 15–41)
Albumin: 3.1 g/dL — ABNORMAL LOW (ref 3.5–5.0)
Alkaline Phosphatase: 76 U/L (ref 38–126)
Anion gap: 13 (ref 5–15)
BUN: 23 mg/dL — AB (ref 6–20)
CHLORIDE: 104 mmol/L (ref 101–111)
CO2: 24 mmol/L (ref 22–32)
Calcium: 9.1 mg/dL (ref 8.9–10.3)
Creatinine, Ser: 1.15 mg/dL — ABNORMAL HIGH (ref 0.44–1.00)
GFR, EST AFRICAN AMERICAN: 53 mL/min — AB (ref >60)
GFR, EST NON AFRICAN AMERICAN: 46 mL/min — AB (ref >60)
Glucose, Bld: 262 mg/dL — ABNORMAL HIGH (ref 65–99)
POTASSIUM: 4.1 mmol/L (ref 3.5–5.1)
SODIUM: 141 mmol/L (ref 135–145)
Total Bilirubin: 0.8 mg/dL (ref 0.3–1.2)
Total Protein: 6 g/dL — ABNORMAL LOW (ref 6.5–8.1)

## 2018-01-21 LAB — CBC
HEMATOCRIT: 35.5 % — AB (ref 36.0–46.0)
Hemoglobin: 11.3 g/dL — ABNORMAL LOW (ref 12.0–15.0)
MCH: 29.7 pg (ref 26.0–34.0)
MCHC: 31.8 g/dL (ref 30.0–36.0)
MCV: 93.2 fL (ref 78.0–100.0)
Platelets: 271 10*3/uL (ref 150–400)
RBC: 3.81 MIL/uL — AB (ref 3.87–5.11)
RDW: 14.2 % (ref 11.5–15.5)
WBC: 9.2 10*3/uL (ref 4.0–10.5)

## 2018-01-21 LAB — CK: CK TOTAL: 2147 U/L — AB (ref 38–234)

## 2018-01-21 LAB — GLUCOSE, CAPILLARY
GLUCOSE-CAPILLARY: 119 mg/dL — AB (ref 65–99)
GLUCOSE-CAPILLARY: 209 mg/dL — AB (ref 65–99)
Glucose-Capillary: 251 mg/dL — ABNORMAL HIGH (ref 65–99)
Glucose-Capillary: 156 mg/dL — ABNORMAL HIGH (ref 65–99)

## 2018-01-21 MED ORDER — METOPROLOL TARTRATE 12.5 MG HALF TABLET: 12.5000 mg | ORAL_TABLET | Freq: Two times a day (BID) | ORAL | Status: DC

## 2018-01-21 MED ORDER — METOPROLOL TARTRATE 12.5 MG HALF TABLET
12.5000 mg | ORAL_TABLET | Freq: Two times a day (BID) | ORAL | Status: DC
  Administered 2018-01-21 – 2018-01-24 (×6): 12.5 mg via ORAL
  Filled 2018-01-21 (×6): qty 1

## 2018-01-21 NOTE — Progress Notes (Addendum)
PROGRESS NOTE    Cheryl Carey  CVE:[id number] DOB: 1946/05/04 DOA: 01/19/2018 PCP: Jonathon Jordan, MD   Brief Narrative:  72 yo BF PMHx HTN, DM Type 2 uncontrolled with complications, HTN, HLD, Chronic Low Back Pain  Patient states that last evening around 6 PM she had checked the sugar was low and she had supper and following which she talked to her sister. At around 8 PM she was supposed to call her sister again but since she did not call back to her sister was trying to reach her. Since they were unable to reach the patient for almost 3-4 hours the neighbors had to call him please had to break in. Patient was found to be fallen on her chair onto the floor. Patient was alert awake initially appeared confused. There was blood on nostrils and forehead. Blood sugar at the site was 120 as per the ER physician. Patient did not have any chest pain or shortness of breath or had no weakness of the upper lower extremities. Denies any headache abdominal pain diarrhea nausea vomiting.   Blood sugar was 120 per EMS.    In the ER patient CT head and neck did not show anything acute except for thyroid nodules.  Acute abdominal series was unremarkable.  EKG noted NSR with QTc of 423m.  Patient was found to be hypothermic with temperature in the 90s F.  Lactic acid was 5.  Blood cultures were obtained and patient was empirically started on vancomycin and Zosyn.  It was noted that patient had some swelling of the lower lip.      Subjective: 2/13  A/O 4 negative CP, negative SOB, negative abdominal pain. Negative N/V. Positive back pain (recent back surgery).   Assessment & Plan:   Principal Problem:   SIRS (systemic inflammatory response syndrome) (HCC) Active Problems:   Hypertension   Diabetes mellitus type 2 in nonobese (HCC)   CKD (chronic kidney disease) stage 3, GFR 30-59 ml/min (HCC)  SIRS Aspirin admission patient met criteria for SIRS: Hypothermic, elevated lactate acid. However most  likely secondary to severe hypothermia and rhabdomyolysis. -lactic acid normalized. Off antibiotics will follow for signs of infection.  Diabetes type 2 uncontrolled with complication/Hypoglycemia   -2/13 Hemoglobin A1c= 11.3 -Lantus 18 units daily -NovoLog 6 units breakfast: 4 units lunch: 2 units dinner -Sensitive SSI -Per patient takes 12U of TTyler Aas NOT 20 as reported in the chart   Rhabdomyolysis/allergic reaction   Recent Labs  Lab 01/19/18 0138 01/19/18 0314 01/20/18 0758 01/21/18 0628  CKTOTAL 630* 1,859* 3,523* 2,147*  -Most likely secondary to Zosyn. -Normal saline 1223mhr    Acute Kidney Injury   . Recent Labs  Lab 01/19/18 0138 01/19/18 0146 01/19/18 0314 01/20/18 0758 01/21/18 0628  CREATININE 1.54* 1.40* 1.67* 1.33* 1.15*  -Improving with hydration. Most likely secondary to rhabdomyolysis and uncontrolled diabetes   Epistaxis / Hematemesis patient was found to be having some blood around the nostrils and also had some vomitus which look liked blood - no evidence of ongoing bleeding at this time    Normocytic anemia  -No evidence of acute bleed Recent Labs  Lab 01/19/18 0138 01/19/18 0146 01/19/18 0314 01/20/18 0758 01/21/18 0628  HGB 11.1* 11.6* 12.5 11.2* 11.3*  -Hemoglobin stable    Thyroid nodule (1.7 cm) -Will require thyroid US[place]oreaCan be completed as outpatient -Patient seen by Endocrinologist Dr. BaBubba CampEssential HTN -Amlodipine 10 mg daily -Start Metoprolol 12.5 mg BID -Hold ACEI/ARB secondary to acute kidney injury  Chronic Back pain -Patient with recent back surgery -Continue PRN Flexeril     DVT prophylaxis: Heparin Code Status: Full Family Communication: None Disposition Plan: Discharge in next 48-72 hours hours   Consultants:  None  Procedures/Significant Events:  None   I have personally reviewed and interpreted all radiology studies and my findings are as above.  VENTILATOR  SETTINGS:    Cultures   Antimicrobials: Anti-infectives (From admission, onward)   Start     Stop   01/20/18 0000  vancomycin (VANCOCIN) 1,250 mg in sodium chloride 0.9 % 250 mL IVPB  Status:  Discontinued     01/19/18 1230   01/19/18 0900  aztreonam (AZACTAM) 1 g in sodium chloride 0.9 % 100 mL IVPB  Status:  Discontinued     01/19/18 1230   01/19/18 0830  aztreonam (AZACTAM) 1 g in dextrose 5 % 50 mL IVPB  Status:  Discontinued     01/19/18 0852   01/19/18 0800  piperacillin-tazobactam (ZOSYN) IVPB 3.375 g  Status:  Discontinued     01/19/18 0826   01/19/18 0330  vancomycin (VANCOCIN) 500 mg in sodium chloride 0.9 % 100 mL IVPB     01/19/18 0657   01/19/18 0130  vancomycin (VANCOCIN) IVPB 1000 mg/200 mL premix     01/19/18 0351   01/19/18 0130  piperacillin-tazobactam (ZOSYN) IVPB 3.375 g     01/19/18 0351       Devices    LINES / TUBES:      Continuous Infusions: . sodium chloride 85 mL/hr at 01/20/18 1138     Objective: Vitals:   01/20/18 1923 01/20/18 2355 01/21/18 0436 01/21/18 0655  BP: (!) 146/66 (!) 154/75 (!) 174/78 (!) 142/61  Pulse: 88 87 89   Resp: (!) 26 20    Temp: 99.3 F (37.4 C) 98.2 F (36.8 C) 98.8 F (37.1 C)   TempSrc: Oral Oral Axillary   SpO2: 98% 96% 93%   Weight:      Height:        Intake/Output Summary (Last 24 hours) at 01/21/2018 0841 Last data filed at 01/20/2018 2300 Gross per 24 hour  Intake 966.17 ml  Output -  Net 966.17 ml   Filed Weights   01/19/18 0300 01/19/18 0508 01/20/18 0500  Weight: 181 lb (82.1 kg) 187 lb 6.3 oz (85 kg) 184 lb 15.5 oz (83.9 kg)    Examination:  General: A/O 4, No acute respiratory distress Neck:  Negative scars, masses, torticollis, lymphadenopathy, JVD Lungs: Clear to auscultation bilaterally without wheezes or crackles Cardiovascular: Regular rate and rhythm without murmur gallop or rub normal S1 and S2 Abdomen: negative abdominal pain, nondistended, positive soft, bowel sounds,  no rebound, no ascites, no appreciable mass Extremities: No significant cyanosis, clubbing, or edema bilateral lower extremities Skin: Negative rashes, lesions, ulcers Psychiatric:  Negative depression, negative anxiety, negative fatigue, negative mania  Central nervous system:  Cranial nerves II through XII intact, tongue/uvula midline, all extremities muscle strength 5/5, sensation intact throughout,  negative dysarthria, negative expressive aphasia, negative receptive aphasia.  .     Data Reviewed: Care during the described time interval was provided by me .  I have reviewed this patient's available data, including medical history, events of note, physical examination, and all test results as part of my evaluation.   CBC: Recent Labs  Lab 01/19/18 0138 01/19/18 0146 01/19/18 0314 01/20/18 0758 01/21/18 0628  WBC 8.5  --  13.6* 15.1* 9.2  NEUTROABS 6.6  --   --   --   --  HGB 11.1* 11.6* 12.5 11.2* 11.3*  HCT 34.9* 34.0* 38.7 35.8* 35.5*  MCV 92.3  --  91.5 91.3 93.2  PLT 260  --  272 272 412   Basic Metabolic Panel: Recent Labs  Lab 01/19/18 0138 01/19/18 0146 01/19/18 0314 01/20/18 0758 01/21/18 0628  NA 141 143 137 139 141  K 3.2* 3.2* 4.2 4.3 4.1  CL 107 105 99* 107 104  CO2 20*  --  21* 22 24  GLUCOSE 122* 115* 176* 351* 262*  BUN 26* 26* 31* 25* 23*  CREATININE 1.54* 1.40* 1.67* 1.33* 1.15*  CALCIUM 8.2*  --  9.5 8.6* 9.1  MG  --   --  2.0  --   --    GFR: Estimated Creatinine Clearance: 42.1 mL/min (A) (by C-G formula based on SCr of 1.15 mg/dL (H)). Liver Function Tests: Recent Labs  Lab 01/19/18 0138 01/19/18 0314 01/20/18 0758 01/21/18 0628  AST 34 55* 99* 86*  ALT 19 25 41 46  ALKPHOS 69 80 71 76  BILITOT 0.5 0.8 0.9 0.8  PROT 5.8* 7.0 5.9* 6.0*  ALBUMIN 3.1* 3.6 3.2* 3.1*   No results for input(s): LIPASE, AMYLASE in the last 168 hours. No results for input(s): AMMONIA in the last 168 hours. Coagulation Profile: No results for input(s):  INR, PROTIME in the last 168 hours. Cardiac Enzymes: Recent Labs  Lab 01/19/18 0138 01/19/18 0314 01/19/18 0852 01/19/18 1521 01/20/18 0758 01/21/18 0628  CKTOTAL 630* 1,859*  --   --  3,523* 2,147*  CKMB 9.7*  --   --   --   --   --   TROPONINI  --  0.11* 0.19* 0.15* 0.09*  --    BNP (last 3 results) No results for input(s): PROBNP in the last 8760 hours. HbA1C: Recent Labs    01/19/18 0314  HGBA1C 7.9*   CBG: Recent Labs  Lab 01/20/18 0818 01/20/18 1221 01/20/18 1629 01/20/18 2118 01/21/18 0558  GLUCAP 312* 241* 292* 202* 251*   Lipid Profile: No results for input(s): CHOL, HDL, LDLCALC, TRIG, CHOLHDL, LDLDIRECT in the last 72 hours. Thyroid Function Tests: Recent Labs    01/19/18 0314  TSH 1.503   Anemia Panel: No results for input(s): VITAMINB12, FOLATE, FERRITIN, TIBC, IRON, RETICCTPCT in the last 72 hours. Urine analysis:    Component Value Date/Time   COLORURINE YELLOW 06/16/2014 1936   APPEARANCEUR CLEAR 06/16/2014 1936   LABSPEC 1.030 06/16/2014 1936   PHURINE 5.0 06/16/2014 1936   GLUCOSEU >1000 (A) 06/16/2014 1936   HGBUR NEGATIVE 06/16/2014 1936   BILIRUBINUR NEGATIVE 06/16/2014 1936   KETONESUR 15 (A) 06/16/2014 1936   PROTEINUR NEGATIVE 06/16/2014 1936   UROBILINOGEN 0.2 06/16/2014 1936   NITRITE NEGATIVE 06/16/2014 1936   LEUKOCYTESUR NEGATIVE 06/16/2014 1936   Sepsis Labs: _0 (procalcitonin:4,lacticidven:4)  ) Recent Results (from the past 240 hour(s))  Blood Culture (routine x 2)     Status: None (Preliminary result)   Collection Time: 01/19/18  1:35 AM  Result Value Ref Range Status   Specimen Description BLOOD LEFT ANTECUBITAL  Final   Special Requests   Final    BOTTLES DRAWN AEROBIC AND ANAEROBIC Blood Culture adequate volume   Culture   Final    NO GROWTH 1 DAY Performed at [facility], [place] [address] [place], [place] [address]    Report Status PENDING  Incomplete  Blood Culture (routine x 2)     Status:  None (Preliminary result)   Collection Time: 01/19/18  2:38 AM  Result Value Ref Range Status   Specimen Description BLOOD RIGHT ANTECUBITAL  Final   Special Requests   Final    BOTTLES DRAWN AEROBIC AND ANAEROBIC Blood Culture adequate volume   Culture   Final    NO GROWTH 1 DAY Performed at [facility], [address], [place], [place] [address]    Report Status PENDING  Incomplete  MRSA PCR Screening     Status: None   Collection Time: 01/19/18  5:38 AM  Result Value Ref Range Status   MRSA by PCR NEGATIVE NEGATIVE Final    Comment:        The GeneXpert MRSA Assay (FDA approved for NASAL specimens only), is one component of a comprehensive MRSA colonization surveillance program. It is not intended to diagnose MRSA infection nor to guide or monitor treatment for MRSA infections. Performed at [facility], [place] [address] [place], [place] [address]          Radiology Studies: Mr Brain Wo Contrast  Result Date: 01/19/2018 CLINICAL DATA:  Fall.  Altered level of consciousness.  Confusion. EXAM: MRI HEAD WITHOUT CONTRAST TECHNIQUE: Multiplanar, multiecho pulse sequences of the brain and surrounding structures were obtained without intravenous contrast. COMPARISON:  CT head earlier today. FINDINGS: The patient was unable to remain motionless for the exam. Small or subtle lesions could be overlooked. Brain: No evidence for acute infarction, hemorrhage, mass lesion, hydrocephalus, or extra-axial fluid. Mild to moderate cerebral and cerebellar atrophy. Mild to moderate subcortical and periventricular T2 and FLAIR hyperintensities, likely chronic microvascular ischemic change. Vascular: Flow voids are maintained throughout the carotid, basilar, and vertebral arteries. There are no areas of chronic hemorrhage. Skull and upper cervical spine: Unremarkable visualized calvarium, skullbase, and cervical vertebrae. Pituitary, pineal, cerebellar tonsils unremarkable. No upper  cervical cord lesions. Sinuses/Orbits: No orbital masses or proptosis. Globes appear symmetric. Sinuses appear well aerated, without evidence for air-fluid level. Other: No nasopharyngeal pathology or mastoid fluid. Scalp and other visualized extracranial soft tissues grossly unremarkable. IMPRESSION: Motion degraded scan. Atrophy and small vessel disease. No definite acute intracranial findings. Similar appearance to prior CT. Electronically Signed   By: Staci Righter M.D.   On: 01/19/2018 11:46   Ct Maxillofacial Wo Contrast  Result Date: 01/19/2018 CLINICAL DATA:  Patient found down today with a nose bleed. Initial encounter. EXAM: CT MAXILLOFACIAL WITHOUT CONTRAST TECHNIQUE: Multidetector CT imaging of the maxillofacial structures was performed. Multiplanar CT image reconstructions were also generated. COMPARISON:  Head and cervical spine CT scan this same day. FINDINGS: Osseous: No fracture or mandibular dislocation. No destructive process. Orbits: Negative. No traumatic or inflammatory finding. Status post bilateral lens extraction. Sinuses: Clear. Soft tissues: Negative. Limited intracranial: No acute finding. IMPRESSION: No acute abnormality. Electronically Signed   By: Inge Rise M.D.   On: 01/19/2018 10:40        Scheduled Meds: . amLODipine  10 mg Oral Daily  . aspirin  81 mg Oral Daily  . heparin injection (subcutaneous)  5,000 Units Subcutaneous Q8H  . insulin aspart  0-9 Units Subcutaneous TID WC  . insulin aspart  6 Units Subcutaneous Q breakfast   And  . insulin aspart  4 Units Subcutaneous Q lunch   And  . insulin aspart  2 Units Subcutaneous Q supper  . insulin glargine  18 Units Subcutaneous Daily  . latanoprost  1 drop Both Eyes QHS   Continuous Infusions: . sodium chloride 85 mL/hr at 01/20/18 1138  LOS: 2 days    Time spent: 40 minutes    Idonia Zollinger, Geraldo Docker, MD [facility] Pager [phone]   If 7PM-7AM, please contact  night-coverage [email or web address] Password [facility] 01/21/2018, 8:41 AM

## 2018-01-22 ENCOUNTER — Inpatient Hospital Stay (HOSPITAL_COMMUNITY): Payer: Medicare Other

## 2018-01-22 DIAGNOSIS — R404 Transient alteration of awareness: Secondary | ICD-10-CM

## 2018-01-22 DIAGNOSIS — N179 Acute kidney failure, unspecified: Secondary | ICD-10-CM

## 2018-01-22 DIAGNOSIS — T796XXA Traumatic ischemia of muscle, initial encounter: Secondary | ICD-10-CM

## 2018-01-22 DIAGNOSIS — E042 Nontoxic multinodular goiter: Secondary | ICD-10-CM

## 2018-01-22 DIAGNOSIS — E869 Volume depletion, unspecified: Secondary | ICD-10-CM

## 2018-01-22 DIAGNOSIS — E872 Acidosis: Secondary | ICD-10-CM

## 2018-01-22 DIAGNOSIS — R6510 Systemic inflammatory response syndrome (SIRS) of non-infectious origin without acute organ dysfunction: Secondary | ICD-10-CM

## 2018-01-22 DIAGNOSIS — T68XXXA Hypothermia, initial encounter: Secondary | ICD-10-CM

## 2018-01-22 LAB — MAGNESIUM: MAGNESIUM: 1.5 mg/dL — AB (ref 1.7–2.4)

## 2018-01-22 LAB — BASIC METABOLIC PANEL
Anion gap: 11 (ref 5–15)
BUN: 17 mg/dL (ref 6–20)
CO2: 26 mmol/L (ref 22–32)
CREATININE: 1.09 mg/dL — AB (ref 0.44–1.00)
Calcium: 9.2 mg/dL (ref 8.9–10.3)
Chloride: 106 mmol/L (ref 101–111)
GFR calc Af Amer: 57 mL/min — ABNORMAL LOW (ref >60)
GFR, EST NON AFRICAN AMERICAN: 49 mL/min — AB (ref >60)
GLUCOSE: 178 mg/dL — AB (ref 65–99)
Potassium: 3.9 mmol/L (ref 3.5–5.1)
SODIUM: 143 mmol/L (ref 135–145)

## 2018-01-22 LAB — CK: Total CK: 1242 U/L — ABNORMAL HIGH (ref 38–234)

## 2018-01-22 LAB — GLUCOSE, CAPILLARY
GLUCOSE-CAPILLARY: 228 mg/dL — AB (ref 65–99)
GLUCOSE-CAPILLARY: 60 mg/dL — AB (ref 65–99)
GLUCOSE-CAPILLARY: 259 mg/dL — AB (ref 65–99)
Glucose-Capillary: 174 mg/dL — ABNORMAL HIGH (ref 65–99)
Glucose-Capillary: 59 mg/dL — ABNORMAL LOW (ref 65–99)
Glucose-Capillary: 149 mg/dL — ABNORMAL HIGH (ref 65–99)

## 2018-01-22 MED ORDER — MAGNESIUM SULFATE 2 GM/50ML IV SOLN
2.0000 g | Freq: Once | INTRAVENOUS | Status: AC
Start: 2018-01-22 — End: 2018-01-22
  Administered 2018-01-22: 2 g via INTRAVENOUS
  Filled 2018-01-22 (×2): qty 50

## 2018-01-22 MED ORDER — DEXTROSE 50 % IV SOLN
INTRAVENOUS | Status: AC
  Administered 2018-01-22: 18:00:00
  Filled 2018-01-22: qty 50

## 2018-01-22 MED ORDER — POTASSIUM CHLORIDE IN NACL 20-0.9 MEQ/L-% IV SOLN
INTRAVENOUS | Status: DC
  Administered 2018-01-22 – 2018-01-24 (×3): via INTRAVENOUS
  Filled 2018-01-22 (×2): qty 1000

## 2018-01-22 NOTE — Care Management Note (Signed)
Case Management Note  Patient Details  Name: Cheryl Carey MRN: [id number] Date of Birth: [birth year]  Subjective/Objective:   72 yr old female admitted with hypoglycemic event.                  Action/Plan:  Dr. Dartha Lodgegbata asked Case Manager [facility] and assessment for compliance. Case Mnager spoke with patient concerning Home Health RN and offered choice for Home Health Agency. Patient politely declined Home Health. She says she sees her endocrinologist regularly and should she have any problems she can contact the office. Patient also said she prefers not to have anyone in her home as she is in process of packing up [place]home. [place] [facility]    Expected Discharge Date:    01/23/18              Expected Discharge Plan:  Home/Self Care  In-House Referral:  NA  Discharge planning Services  CM Consult  Post Acute Care Choice:  Home Health Choice offered to:  Patient  DME Arranged:  N/A DME Agency:  NA  HH Arranged:  Patient Refused HH HH Agency:  NA  Status of Service:  Completed, signed off  If discussed at Long Length of Stay Meetings, dates discussed:    Additional Comments:  Durenda GuthrieBrady, Dnaiel Voller Naomi, RN 01/22/2018, 3:03 PM

## 2018-01-22 NOTE — Plan of Care (Signed)
  Progressing Health Behavior/Discharge Planning: Ability to manage health-related needs will improve 01/22/2018 1648 - Progressing by Darreld Mcleanox, Serjio Deupree, RN Clinical Measurements: Ability to maintain clinical measurements within normal limits will improve 01/22/2018 1648 - Progressing by Darreld Mcleanox, Aryelle Figg, RN Will remain free from infection 01/22/2018 1648 - Progressing by Darreld Mcleanox, Daine Croker, RN Diagnostic test results will improve 01/22/2018 1648 - Progressing by Darreld Mcleanox, Carmela Piechowski, RN Respiratory complications will improve 01/22/2018 1648 - Progressing by Darreld Mcleanox, Allene Furuya, RN Cardiovascular complication will be avoided 01/22/2018 1648 - Progressing by Darreld Mcleanox, Brendon Christoffel, RN Activity: Risk for activity intolerance will decrease 01/22/2018 1648 - Progressing by Darreld Mcleanox, Linette Gunderson, RN Nutrition: Adequate nutrition will be maintained 01/22/2018 1648 - Progressing by Darreld Mcleanox, Mayley Lish, RN Coping: Level of anxiety will decrease 01/22/2018 1648 - Progressing by Darreld Mcleanox, Roshunda Keir, RN Elimination: Will not experience complications related to bowel motility 01/22/2018 1648 - Progressing by Darreld Mcleanox, Erandy Mceachern, RN Will not experience complications related to urinary retention 01/22/2018 1648 - Progressing by Darreld Mcleanox, Eriq Hufford, RN Pain Managment: General experience of comfort will improve 01/22/2018 1648 - Progressing by Darreld Mcleanox, Jaiel Saraceno, RN Safety: Ability to remain free from injury will improve 01/22/2018 1648 - Progressing by Darreld Mcleanox, Dannell Raczkowski, RN Skin Integrity: Risk for impaired skin integrity will decrease 01/22/2018 1648 - Progressing by Darreld Mcleanox, Gerritt Galentine, RN

## 2018-01-22 NOTE — Progress Notes (Signed)
Dr. Dartha Lodgegbata asked me to see this patient. Spoke with her briefly since another coordinator had seen her on 2/12. Patient states that she had a hypgoglycemic episode at home. She seems to be very knowledgeable in knowing what she needs to do to prevent hypoglycemia. States that she checks her blood sugars 5-6 times per day and always before she leaves the house. She always has either juice or peanut crackers with her in case of a low blood sugar reaction. States that she sees Dr. Lurene ShadowBallen, endocrinologist, very often and will follow up with her.   Smith MinceKendra Grissel Tyrell RN BSN CDE Diabetes Coordinator Pager: [phone]  8am-5pm

## 2018-01-22 NOTE — Progress Notes (Signed)
PROGRESS NOTE    Cheryl Carey  SHF:[id number] DOB: 04/14/1946 DOA: 01/19/2018 PCP: Jonathon Jordan, MD   Brief Narrative:  Patient is a 72 year old African-American female with past medical history significant for hypertension, DM Type 2 uncontrolled with complications, HLD, Chronic Low Back Pain.  Patient states that last evening around 6 PM she had checked the sugar was low and she had supper and following which she talked to her sister. At around 8 PM she was supposed to call her sister again but since she did not call back to her sister was trying to reach her. Since they were unable to reach the patient for almost 3-4 hours the neighbors had to call him please had to break in. Patient was found to be fallen on her chair onto the floor. Patient was alert awake initially appeared confused. There was blood on nostrils and forehead. Blood sugar at the site was 120 as per the ER physician. Patient did not have any chest pain or shortness of breath or had no weakness of the upper lower extremities. Denies any headache abdominal pain diarrhea nausea vomiting.  Blood sugar was 120 per EMS.    In the ER patient CT head and neck did not show anything acute except for thyroid nodules. Acute abdominal series was unremarkable.  EKG noted NSR with QTc of 4108m.  Patient was found to be hypothermic with temperature in the 90s F.  Lactic acid was 5.  Blood cultures were obtained and patient was empirically started on vancomycin and Zosyn.  It was noted that patient had some swelling of the lower lip.  Subjective: 01/22/2018: Patient seen.  No new complaints.  No fever or chills.  No shortness of breath.  Blood sugar has been fairly controlled.  Blood sugar has been fairly controlled.  No loss of consciousness.   Assessment & Plan:   Principal Problem:   SIRS (systemic inflammatory response syndrome) (HCC) Active Problems:   Hypertension   Diabetes mellitus type 2 in nonobese (HCC)   CKD (chronic  kidney disease) stage 3, GFR 30-59 ml/min (HCC)  SIRS Aspirin admission patient met criteria for SIRS: Hypothermic, elevated lactate acid. However most likely secondary to severe hypothermia and rhabdomyolysis. -lactic acid normalized. -Off antibiotics -Continue to monitor for any signs of infection. -We will proceed with the chest x-ray. -Pro-calcitonin done on 01/19/2018 was less than 0.1.  Diabetes type 2 uncontrolled with complication/Hypoglycemia   -HbA1c done on 01/19/2018 was 7.9.  -Lantus 18 units daily -NovoLog 6 units breakfast: 4 units lunch: 2 units dinner -Sensitive SSI -Per patient takes 12U of Tresiba, NOT 20 as reported in the chart, however, the patient is not currently interested by.  Rhabdomyolysis: -Most likely secondary to being found down at home -Rhabdomyolysis has resolved significantly. -Decreased normal saline to 50 cc/h.  Recent Labs  Lab 01/19/18 0138 01/19/18 0314 01/20/18 0758 01/21/18 0628 01/22/18 0419  CKTOTAL 630* 1,859* 3,523* 2,147* 1,242*    Allergic reaction: -Possible allergy reaction to Zosyn reported. -Swelling of the lip has resolved. -Zosyn has been discontinued. -Zosyn will be listed as possible allergy.    Acute Kidney Injury: -Acute kidney injury is likely secondary to rhabdomyolysis and volume depletion. -Acute kidney injury has resolved significantly.  .Marland KitchenRecent Labs  Lab 01/19/18 0138 01/19/18 0146 01/19/18 0314 01/20/18 0758 01/21/18 0628 01/22/18 0419  CREATININE 1.54* 1.40* 1.67* 1.33* 1.15* 1.09*     Epistaxis / Hematemesis patient was found to be having some blood around the nostrils and  also had some vomitus which look liked blood - no evidence of ongoing bleeding at this time    Normocytic anemia  -No evidence of acute bleed Recent Labs  Lab 01/19/18 0138 01/19/18 0146 01/19/18 0314 01/20/18 0758 01/21/18 0628  HGB 11.1* 11.6* 12.5 11.2* 11.3*  -Hemoglobin stable    Thyroid nodule (1.7  cm) -Will require thyroid US. Can be completed as outpatient -Patient seen by Endocrinologist Dr. Bubba Camp  Essential HTN -Amlodipine 10 mg daily -Start Metoprolol 12.5 mg BID -Hold ACEI/ARB secondary to acute kidney injury   Chronic Back pain -Patient with recent back surgery -Continue PRN Flexeril     DVT prophylaxis: Heparin Code Status: Full Family Communication: None Disposition Plan: Discharge in next 48-72 hours hours   Consultants:  None  Procedures/Significant Events:  None   I have personally reviewed and interpreted all radiology studies and my findings are as above.  VENTILATOR SETTINGS:    Cultures   Antimicrobials: Anti-infectives (From admission, onward)   Start     Stop   01/20/18 0000  vancomycin (VANCOCIN) 1,250 mg in sodium chloride 0.9 % 250 mL IVPB  Status:  Discontinued     01/19/18 1230   01/19/18 0900  aztreonam (AZACTAM) 1 g in sodium chloride 0.9 % 100 mL IVPB  Status:  Discontinued     01/19/18 1230   01/19/18 0830  aztreonam (AZACTAM) 1 g in dextrose 5 % 50 mL IVPB  Status:  Discontinued     01/19/18 0852   01/19/18 0800  piperacillin-tazobactam (ZOSYN) IVPB 3.375 g  Status:  Discontinued     01/19/18 0826   01/19/18 0330  vancomycin (VANCOCIN) 500 mg in sodium chloride 0.9 % 100 mL IVPB     01/19/18 0657   01/19/18 0130  vancomycin (VANCOCIN) IVPB 1000 mg/200 mL premix     01/19/18 0351   01/19/18 0130  piperacillin-tazobactam (ZOSYN) IVPB 3.375 g     01/19/18 0351       Devices    LINES / TUBES:      Continuous Infusions: . 0.9 % NaCl with KCl 20 mEq / L 50 mL/hr at 01/22/18 1519     Objective: Vitals:   01/21/18 1321 01/21/18 2010 01/22/18 0648 01/22/18 1429  BP: 137/64 (!) 173/72 (!) 164/76 (!) 152/71  Pulse: 81 91 80 80  Resp: 16 18 16 16   Temp: 98.5 F (36.9 C) 98.6 F (37 C) 98.7 F (37.1 C) 98.4 F (36.9 C)  TempSrc: Oral Oral Oral Oral  SpO2: 99% 98% 99% 98%  Weight:   190 lb 14.7 oz (86.6 kg)    Height:        Intake/Output Summary (Last 24 hours) at 01/22/2018 1808 Last data filed at 01/22/2018 1230 Gross per 24 hour  Intake 480 ml  Output 900 ml  Net -420 ml   Filed Weights   01/19/18 0508 01/20/18 0500 01/22/18 0648  Weight: 187 lb 6.3 oz (85 kg) 184 lb 15.5 oz (83.9 kg) 190 lb 14.7 oz (86.6 kg)    Examination:  General: The patient is awake, alert and oriented to time, place and person.  The patient has not in any obvious distress.   Neck:  Negative scars, masses, torticollis, lymphadenopathy, JVD Lungs: Clear to auscultation bilaterally without wheezes or crackles Cardiovascular: Regular rate and rhythm without murmur gallop or rub normal S1 and S2 Abdomen: negative abdominal pain, nondistended, positive soft, bowel sounds, no rebound, no ascites, no appreciable mass Extremities: Fullness of the legs  and bilateral leg edema. Psychiatric:  Negative depression, negative anxiety, negative fatigue, negative mania  Central nervous system: Patient is awake, alert and oriented to time, place and person.  Patient moves all limbs.   Data Reviewed: Care during the described time interval was provided by me .  I have reviewed this patient's available data, including medical history, events of note, physical examination, and all test results as part of my evaluation.   CBC: Recent Labs  Lab 01/19/18 0138 01/19/18 0146 01/19/18 0314 01/20/18 0758 01/21/18 0628  WBC 8.5  --  13.6* 15.1* 9.2  NEUTROABS 6.6  --   --   --   --   HGB 11.1* 11.6* 12.5 11.2* 11.3*  HCT 34.9* 34.0* 38.7 35.8* 35.5*  MCV 92.3  --  91.5 91.3 93.2  PLT 260  --  272 272 846   Basic Metabolic Panel: Recent Labs  Lab 01/19/18 0138 01/19/18 0146 01/19/18 0314 01/20/18 0758 01/21/18 0628 01/22/18 0419  NA 141 143 137 139 141 143  K 3.2* 3.2* 4.2 4.3 4.1 3.9  CL 107 105 99* 107 104 106  CO2 20*  --  21* 22 24 26   GLUCOSE 122* 115* 176* 351* 262* 178*  BUN 26* 26* 31* 25* 23* 17  CREATININE  1.54* 1.40* 1.67* 1.33* 1.15* 1.09*  CALCIUM 8.2*  --  9.5 8.6* 9.1 9.2  MG  --   --  2.0  --   --  1.5*   GFR: Estimated Creatinine Clearance: 45.2 mL/min (A) (by C-G formula based on SCr of 1.09 mg/dL (H)). Liver Function Tests: Recent Labs  Lab 01/19/18 0138 01/19/18 0314 01/20/18 0758 01/21/18 0628  AST 34 55* 99* 86*  ALT 19 25 41 46  ALKPHOS 69 80 71 76  BILITOT 0.5 0.8 0.9 0.8  PROT 5.8* 7.0 5.9* 6.0*  ALBUMIN 3.1* 3.6 3.2* 3.1*   No results for input(s): LIPASE, AMYLASE in the last 168 hours. No results for input(s): AMMONIA in the last 168 hours. Coagulation Profile: No results for input(s): INR, PROTIME in the last 168 hours. Cardiac Enzymes: Recent Labs  Lab 01/19/18 0138 01/19/18 0314 01/19/18 0852 01/19/18 1521 01/20/18 0758 01/21/18 0628 01/22/18 0419  CKTOTAL 630* 1,859*  --   --  3,523* 2,147* 1,242*  CKMB 9.7*  --   --   --   --   --   --   TROPONINI  --  0.11* 0.19* 0.15* 0.09*  --   --    BNP (last 3 results) No results for input(s): PROBNP in the last 8760 hours. HbA1C: No results for input(s): HGBA1C in the last 72 hours. CBG: Recent Labs  Lab 01/22/18 0649 01/22/18 1117 01/22/18 1632 01/22/18 1659 01/22/18 1742  GLUCAP 174* 228* 59* 60* 259*   Lipid Profile: No results for input(s): CHOL, HDL, LDLCALC, TRIG, CHOLHDL, LDLDIRECT in the last 72 hours. Thyroid Function Tests: No results for input(s): TSH, T4TOTAL, FREET4, T3FREE, THYROIDAB in the last 72 hours. Anemia Panel: No results for input(s): VITAMINB12, FOLATE, FERRITIN, TIBC, IRON, RETICCTPCT in the last 72 hours. Urine analysis:    Component Value Date/Time   COLORURINE YELLOW 06/16/2014 1936   APPEARANCEUR CLEAR 06/16/2014 1936   LABSPEC 1.030 06/16/2014 1936   PHURINE 5.0 06/16/2014 1936   GLUCOSEU >1000 (A) 06/16/2014 1936   HGBUR NEGATIVE 06/16/2014 1936   BILIRUBINUR NEGATIVE 06/16/2014 1936   KETONESUR 15 (A) 06/16/2014 1936   PROTEINUR NEGATIVE 06/16/2014 1936    UROBILINOGEN 0.2 06/16/2014  [place] 06/16/2014 1936   LEUKOCYTESUR NEGATIVE 06/16/2014 1936   Sepsis Labs: @LABRCNTIP (procalcitonin:4,lacticidven:4)  ) Recent Results (from the past 240 hour(s))  Blood Culture (routine x 2)     Status: None (Preliminary result)   Collection Time: 01/19/18  1:35 AM  Result Value Ref Range Status   Specimen Description BLOOD LEFT ANTECUBITAL  Final   Special Requests   Final    BOTTLES DRAWN AEROBIC AND ANAEROBIC Blood Culture adequate volume   Culture   Final    NO GROWTH 3 DAYS Performed at [facility], [address], [place], [place] [address]    Report Status PENDING  Incomplete  Blood Culture (routine x 2)     Status: None (Preliminary result)   Collection Time: 01/19/18  2:38 AM  Result Value Ref Range Status   Specimen Description BLOOD RIGHT ANTECUBITAL  Final   Special Requests   Final    BOTTLES DRAWN AEROBIC AND ANAEROBIC Blood Culture adequate volume   Culture   Final    NO GROWTH 3 DAYS Performed at [facility], [place] [address] [place], [place] [address]    Report Status PENDING  Incomplete  MRSA PCR Screening     Status: None   Collection Time: 01/19/18  5:38 AM  Result Value Ref Range Status   MRSA by PCR NEGATIVE NEGATIVE Final    Comment:        The GeneXpert MRSA Assay (FDA approved for NASAL specimens only), is one component of a comprehensive MRSA colonization surveillance program. It is not intended to diagnose MRSA infection nor to guide or monitor treatment for MRSA infections. Performed at [facility], [place] [address] [place], [place] [address]          Radiology Studies: Dg Chest Port 1 View  Result Date: 01/22/2018 CLINICAL DATA:  Altered mental status.  Cough. EXAM: PORTABLE CHEST 1 VIEW COMPARISON:  01/19/2018. FINDINGS: Mediastinum and hilar structures normal. Heart size stable. Normal pulmonary vascularity. Low lung volumes. No pleural effusion or pneumothorax.  Surgical screw noted over the right shoulder. IMPRESSION: Low lung volumes. No acute cardiopulmonary disease. Chest is stable from prior exam. Electronically Signed   By: [place]   On: 01/22/2018 14:26        Scheduled Meds: . amLODipine  10 mg Oral Daily  . aspirin  81 mg Oral Daily  . dextrose      . heparin injection (subcutaneous)  5,000 Units Subcutaneous Q8H  . insulin aspart  0-9 Units Subcutaneous TID WC  . insulin aspart  6 Units Subcutaneous Q breakfast   And  . insulin aspart  4 Units Subcutaneous Q lunch   And  . insulin aspart  2 Units Subcutaneous Q supper  . insulin glargine  18 Units Subcutaneous Daily  . latanoprost  1 drop Both Eyes QHS  . metoprolol tartrate  12.5 mg Oral BID   Continuous Infusions: . 0.9 % NaCl with KCl 20 mEq / L 50 mL/hr at 01/22/18 1519     LOS: 3 days    Time spent: 40 minutes    Bonnell Public, MD [facility] Pager [phone]  If 7PM-7AM, please contact night-coverage [email or web address] Password [email or web address] 01/22/2018, 6:08 PM

## 2018-01-23 DIAGNOSIS — T383X5A Adverse effect of insulin and oral hypoglycemic [antidiabetic] drugs, initial encounter: Secondary | ICD-10-CM

## 2018-01-23 DIAGNOSIS — N183 Chronic kidney disease, stage 3 (moderate): Secondary | ICD-10-CM

## 2018-01-23 DIAGNOSIS — R4182 Altered mental status, unspecified: Secondary | ICD-10-CM

## 2018-01-23 DIAGNOSIS — E876 Hypokalemia: Secondary | ICD-10-CM

## 2018-01-23 DIAGNOSIS — E160 Drug-induced hypoglycemia without coma: Secondary | ICD-10-CM

## 2018-01-23 DIAGNOSIS — E119 Type 2 diabetes mellitus without complications: Secondary | ICD-10-CM

## 2018-01-23 LAB — GLUCOSE, CAPILLARY
GLUCOSE-CAPILLARY: 276 mg/dL — AB (ref 65–99)
GLUCOSE-CAPILLARY: 37 mg/dL — AB (ref 65–99)
GLUCOSE-CAPILLARY: 86 mg/dL (ref 65–99)
Glucose-Capillary: 129 mg/dL — ABNORMAL HIGH (ref 65–99)
Glucose-Capillary: 203 mg/dL — ABNORMAL HIGH (ref 65–99)
Glucose-Capillary: 227 mg/dL — ABNORMAL HIGH (ref 65–99)
Glucose-Capillary: 241 mg/dL — ABNORMAL HIGH (ref 65–99)
Glucose-Capillary: 255 mg/dL — ABNORMAL HIGH (ref 65–99)
Glucose-Capillary: 334 mg/dL — ABNORMAL HIGH (ref 65–99)

## 2018-01-23 LAB — RENAL FUNCTION PANEL
Albumin: 3 g/dL — ABNORMAL LOW (ref 3.5–5.0)
Anion gap: 12 (ref 5–15)
BUN: 13 mg/dL (ref 6–20)
CO2: 25 mmol/L (ref 22–32)
Calcium: 9.2 mg/dL (ref 8.9–10.3)
Chloride: 101 mmol/L (ref 101–111)
Creatinine, Ser: 1.01 mg/dL — ABNORMAL HIGH (ref 0.44–1.00)
GFR calc Af Amer: >60 mL/min (ref >60)
GFR calc non Af Amer: 53 mL/min — ABNORMAL LOW (ref >60)
Glucose, Bld: 249 mg/dL — ABNORMAL HIGH (ref 65–99)
Phosphorus: 3.3 mg/dL (ref 2.5–4.6)
Potassium: 3.6 mmol/L (ref 3.5–5.1)
Sodium: 138 mmol/L (ref 135–145)

## 2018-01-23 LAB — CK: Total CK: 676 U/L — ABNORMAL HIGH (ref 38–234)

## 2018-01-23 LAB — MAGNESIUM: MAGNESIUM: 1.5 mg/dL — AB (ref 1.7–2.4)

## 2018-01-23 MED ORDER — DEXTROSE 50 % IV SOLN
INTRAVENOUS | Status: AC
  Administered 2018-01-23: 25 mL
  Filled 2018-01-23: qty 50

## 2018-01-23 MED ORDER — INSULIN ASPART 100 UNIT/ML ~~LOC~~ SOLN
0.0000 [IU] | SUBCUTANEOUS | Status: DC
  Administered 2018-01-23: 7 [IU] via SUBCUTANEOUS
  Administered 2018-01-23 (×2): 3 [IU] via SUBCUTANEOUS
  Administered 2018-01-23 (×2): 5 [IU] via SUBCUTANEOUS
  Administered 2018-01-24: 2 [IU] via SUBCUTANEOUS
  Administered 2018-01-24: 5 [IU] via SUBCUTANEOUS
  Administered 2018-01-24: 7 [IU] via SUBCUTANEOUS

## 2018-01-23 MED ORDER — INSULIN GLARGINE 100 UNIT/ML ~~LOC~~ SOLN
5.0000 [IU] | Freq: Two times a day (BID) | SUBCUTANEOUS | Status: DC
  Administered 2018-01-23 – 2018-01-24 (×3): 5 [IU] via SUBCUTANEOUS
  Filled 2018-01-23 (×4): qty 0.05

## 2018-01-23 NOTE — Progress Notes (Signed)
PROGRESS NOTE    Cheryl Carey  YWV:[id number] DOB: [birth year] DOA: 01/19/2018 PCP: Jonathon Jordan, MD   Brief Narrative:  Patient is a 72 year old African-American female with past medical history significant for hypertension, DM Type 2 uncontrolled with complications, HLD, Chronic Low Back Pain.  Patient states that last evening around 6 PM she had checked the sugar was low and she had supper and following which she talked to her sister. At around 8 PM she was supposed to call her sister again but since she did not call back to her sister was trying to reach her. Since they were unable to reach the patient for almost 3-4 hours the neighbors had to call him please had to break in. Patient was found to be fallen on her chair onto the floor. Patient was alert awake initially appeared confused. There was blood on nostrils and forehead. Blood sugar at the site was 120 as per the ER physician. Patient did not have any chest pain or shortness of breath or had no weakness of the upper lower extremities. Denies any headache abdominal pain diarrhea nausea vomiting.  Blood sugar was 120 per EMS.    In the ER patient CT head and neck did not show anything acute except for thyroid nodules. Acute abdominal series was unremarkable.  EKG noted NSR with QTc of 452m.  Patient was found to be hypothermic with temperature in the 90s F.  Lactic acid was 5.  Blood cultures were obtained and patient was empirically started on vancomycin and Zosyn.  It was noted that patient had some swelling of the lower lip.  Subjective: 01/23/2018: Patient seen.  -Blood sugar dropped to 37 [place] late this morning.  According to the patient, she had symptoms of hypoglycemia at the time. -Otherwise, no new complaints.  Assessment & Plan:   Principal Problem:   SIRS (systemic inflammatory response syndrome) (HCC) Active Problems:   Hypertension   Diabetes mellitus type 2 in nonobese (HCC)   CKD (chronic kidney disease) stage  3, GFR 30-59 ml/min (HCC)  SIRS Aspirin admission patient met criteria for SIRS: Hypothermic, elevated lactate acid. However most likely secondary to severe hypothermia and rhabdomyolysis. -lactic acid normalized. -Off antibiotics -Continue to monitor for any signs of infection. -We will proceed with the chest x-ray. -Pro-calcitonin done on 01/19/2018 was less than 0.1. -This has resolved.  Diabetes type 2 uncontrolled with complication/Hypoglycemia   -HbA1c done on 01/19/2018 was 7.9.  -Lantus 18 units daily -NovoLog 6 units breakfast: 4 units lunch: 2 units dinner -Sensitive SSI -Per patient takes 12U of Tresiba, NOT 20 as reported in the chart, however, the patient is not currently interested by. -Blood sugar of 37 noted earlier this morning. -Blood sugars have been checked every 4 hours, with low dose sliding scale insulin coverage. -Caloric count ordered earlier. -After discussing with the diabetic educator, and after reviewing patient has blood sugar trend, will start patient on subcutaneous Lantus 5 units twice daily. -Hopefully by tomorrow, we will have an idea of patient's daily insulin requirement.  Rhabdomyolysis: -Most likely secondary to being found down at home -Rhabdomyolysis has resolved significantly. -Decreased normal saline to 50 cc/h. -CPKs 676 today.  Recent Labs  Lab 01/19/18 0138 01/19/18 0314 01/20/18 0758 01/21/18 0628 01/22/18 0419 01/23/18 0615  CKTOTAL 630* 1,859* 3,523* 2,147* 1,242* 676*    Allergic reaction: -Possible allergy reaction to Zosyn reported. -Swelling of the lip has resolved. -Zosyn has been discontinued. -Zosyn will be listed as possible allergy.  Acute Kidney Injury: -Acute kidney injury is likely secondary to rhabdomyolysis and volume depletion. -Acute kidney injury has resolved significantly.  Marland Kitchen Recent Labs  Lab 01/19/18 0138 01/19/18 0146 01/19/18 0314 01/20/18 0758 01/21/18 0628 01/22/18 0419 01/23/18 0615    CREATININE 1.54* 1.40* 1.67* 1.33* 1.15* 1.09* 1.01*     Epistaxis / Hematemesis patient was found to be having some blood around the nostrils and also had some vomitus which look liked blood - no evidence of ongoing bleeding at this time    Normocytic anemia  -No evidence of acute bleed Recent Labs  Lab 01/19/18 0138 01/19/18 0146 01/19/18 0314 01/20/18 0758 01/21/18 0628  HGB 11.1* 11.6* 12.5 11.2* 11.3*  -Hemoglobin stable    Thyroid nodule (1.7 cm) -Will require thyroid US. Can be completed as outpatient -Patient seen by Endocrinologist Dr. Bubba Camp  Essential HTN -Amlodipine 10 mg daily -Start Metoprolol 12.5 mg BID -Hold ACEI/ARB secondary to acute kidney injury   Chronic Back pain -Patient with recent back surgery -Continue PRN Flexeril     DVT prophylaxis: Heparin Code Status: Full Family Communication: None Disposition Plan: Discharge in next 24 hours.   Consultants:  None  Procedures/Significant Events:  None   I have personally reviewed and interpreted all radiology studies and my findings are as above.  VENTILATOR SETTINGS:    Cultures   Antimicrobials: Anti-infectives (From admission, onward)   Start     Stop   01/20/18 0000  vancomycin (VANCOCIN) 1,250 mg in sodium chloride 0.9 % 250 mL IVPB  Status:  Discontinued     01/19/18 1230   01/19/18 0900  aztreonam (AZACTAM) 1 g in sodium chloride 0.9 % 100 mL IVPB  Status:  Discontinued     01/19/18 1230   01/19/18 0830  aztreonam (AZACTAM) 1 g in dextrose 5 % 50 mL IVPB  Status:  Discontinued     01/19/18 0852   01/19/18 0800  piperacillin-tazobactam (ZOSYN) IVPB 3.375 g  Status:  Discontinued     01/19/18 0826   01/19/18 0330  vancomycin (VANCOCIN) 500 mg in sodium chloride 0.9 % 100 mL IVPB     01/19/18 0657   01/19/18 0130  vancomycin (VANCOCIN) IVPB 1000 mg/200 mL premix     01/19/18 0351   01/19/18 0130  piperacillin-tazobactam (ZOSYN) IVPB 3.375 g     01/19/18 0351        Devices    LINES / TUBES:      Continuous Infusions: . 0.9 % NaCl with KCl 20 mEq / L 50 mL/hr at 01/23/18 1200     Objective: Vitals:   01/22/18 1429 01/22/18 2042 01/23/18 0429 01/23/18 1442  BP: (!) 152/71 (!) 160/80 (!) 180/76 (!) 120/59  Pulse: 80 81 85 85  Resp: 16   16  Temp: 98.4 F (36.9 C) 97.9 F (36.6 C) 98.9 F (37.2 C) 98.1 F (36.7 C)  TempSrc: Oral Oral Oral Oral  SpO2: 98% 95% 97% 98%  Weight:      Height:        Intake/Output Summary (Last 24 hours) at 01/23/2018 1835 Last data filed at 01/23/2018 0439 Gross per 24 hour  Intake -  Output 300 ml  Net -300 ml   Filed Weights   01/19/18 0508 01/20/18 0500 01/22/18 0648  Weight: 187 lb 6.3 oz (85 kg) 184 lb 15.5 oz (83.9 kg) 190 lb 14.7 oz (86.6 kg)    Examination:  General: The patient is awake, alert and oriented to time, place and person.  The patient has not in any obvious distress.   Neck: Neck is supple. Lungs: Clear to auscultation bilaterally. Cardiovascular: S1-S2 Abdomen: Obese, soft and nontender.  Organs are not palpable.  Bowel sounds are present.   Extremities: Leg edema is improving.   Central nervous system: Patient is awake, alert and oriented to time, place and person.  Patient moves all limbs.   Data Reviewed: Care during the described time interval was provided by me .  I have reviewed this patient's available data, including medical history, events of note, physical examination, and all test results as part of my evaluation.   CBC: Recent Labs  Lab 01/19/18 0138 01/19/18 0146 01/19/18 0314 01/20/18 0758 01/21/18 0628  WBC 8.5  --  13.6* 15.1* 9.2  NEUTROABS 6.6  --   --   --   --   HGB 11.1* 11.6* 12.5 11.2* 11.3*  HCT 34.9* 34.0* 38.7 35.8* 35.5*  MCV 92.3  --  91.5 91.3 93.2  PLT 260  --  272 272 361   Basic Metabolic Panel: Recent Labs  Lab 01/19/18 0314 01/20/18 0758 01/21/18 0628 01/22/18 0419 01/23/18 0615  NA 137 139 141 143 138  K 4.2 4.3  4.1 3.9 3.6  CL 99* 107 104 106 101  CO2 21* 22 24 26 25   GLUCOSE 176* 351* 262* 178* 249*  BUN 31* 25* 23* 17 13  CREATININE 1.67* 1.33* 1.15* 1.09* 1.01*  CALCIUM 9.5 8.6* 9.1 9.2 9.2  MG 2.0  --   --  1.5* 1.5*  PHOS  --   --   --   --  3.3   GFR: Estimated Creatinine Clearance: 48.8 mL/min (A) (by C-G formula based on SCr of 1.01 mg/dL (H)). Liver Function Tests: Recent Labs  Lab 01/19/18 0138 01/19/18 0314 01/20/18 0758 01/21/18 0628 01/23/18 0615  AST 34 55* 99* 86*  --   ALT 19 25 41 46  --   ALKPHOS 69 80 71 76  --   BILITOT 0.5 0.8 0.9 0.8  --   PROT 5.8* 7.0 5.9* 6.0*  --   ALBUMIN 3.1* 3.6 3.2* 3.1* 3.0*   No results for input(s): LIPASE, AMYLASE in the last 168 hours. No results for input(s): AMMONIA in the last 168 hours. Coagulation Profile: No results for input(s): INR, PROTIME in the last 168 hours. Cardiac Enzymes: Recent Labs  Lab 01/19/18 0138 01/19/18 0314 01/19/18 4431 01/19/18 1521 01/20/18 0758 01/21/18 0628 01/22/18 0419 01/23/18 0615  CKTOTAL 630* 1,859*  --   --  3,523* 2,147* 1,242* 676*  CKMB 9.7*  --   --   --   --   --   --   --   TROPONINI  --  0.11* 0.19* 0.15* 0.09*  --   --   --    BNP (last 3 results) No results for input(s): PROBNP in the last 8760 hours. HbA1C: No results for input(s): HGBA1C in the last 72 hours. CBG: Recent Labs  Lab 01/23/18 0100 01/23/18 0434 01/23/18 0744 01/23/18 1145 01/23/18 1554  GLUCAP 86 276* 203* 255* 241*   Lipid Profile: No results for input(s): CHOL, HDL, LDLCALC, TRIG, CHOLHDL, LDLDIRECT in the last 72 hours. Thyroid Function Tests: No results for input(s): TSH, T4TOTAL, FREET4, T3FREE, THYROIDAB in the last 72 hours. Anemia Panel: No results for input(s): VITAMINB12, FOLATE, FERRITIN, TIBC, IRON, RETICCTPCT in the last 72 hours. Urine analysis:    Component Value Date/Time   COLORURINE YELLOW 06/16/2014 1936   APPEARANCEUR  CLEAR 06/16/2014 1936   LABSPEC 1.030 06/16/2014 1936    PHURINE 5.0 06/16/2014 1936   GLUCOSEU >1000 (A) 06/16/2014 1936   HGBUR NEGATIVE 06/16/2014 1936   BILIRUBINUR NEGATIVE 06/16/2014 1936   KETONESUR 15 (A) 06/16/2014 1936   PROTEINUR NEGATIVE 06/16/2014 1936   UROBILINOGEN 0.2 06/16/2014 1936   NITRITE NEGATIVE 06/16/2014 1936   LEUKOCYTESUR NEGATIVE 06/16/2014 1936   Sepsis Labs: @LABRCNTIP (procalcitonin:4,lacticidven:4)  ) Recent Results (from the past 240 hour(s))  Blood Culture (routine x 2)     Status: None (Preliminary result)   Collection Time: 01/19/18  1:35 AM  Result Value Ref Range Status   Specimen Description BLOOD LEFT ANTECUBITAL  Final   Special Requests   Final    BOTTLES DRAWN AEROBIC AND ANAEROBIC Blood Culture adequate volume   Culture   Final    NO GROWTH 4 DAYS Performed at [facility], [place] [address] [place], [place] [address]    Report Status PENDING  Incomplete  Blood Culture (routine x 2)     Status: None (Preliminary result)   Collection Time: 01/19/18  2:38 AM  Result Value Ref Range Status   Specimen Description BLOOD RIGHT ANTECUBITAL  Final   Special Requests   Final    BOTTLES DRAWN AEROBIC AND ANAEROBIC Blood Culture adequate volume   Culture   Final    NO GROWTH 4 DAYS Performed at [facility], [place] [address] [place], [place] [address]    Report Status PENDING  Incomplete  MRSA PCR Screening     Status: None   Collection Time: 01/19/18  5:38 AM  Result Value Ref Range Status   MRSA by PCR NEGATIVE NEGATIVE Final    Comment:        The GeneXpert MRSA Assay (FDA approved for NASAL specimens only), is one component of a comprehensive MRSA colonization surveillance program. It is not intended to diagnose MRSA infection nor to guide or monitor treatment for MRSA infections. Performed at [facility], [place] [address], [place], [place] [address]          Radiology Studies: Dg Chest Port 1 View  Result Date: 01/22/2018 CLINICAL DATA:  Altered mental  status.  Cough. EXAM: PORTABLE CHEST 1 VIEW COMPARISON:  01/19/2018. FINDINGS: Mediastinum and hilar structures normal. Heart size stable. Normal pulmonary vascularity. Low lung volumes. No pleural effusion or pneumothorax. Surgical screw noted over the right shoulder. IMPRESSION: Low lung volumes. No acute cardiopulmonary disease. Chest is stable from prior exam. Electronically Signed   By: Draper   On: 01/22/2018 14:26        Scheduled Meds: . amLODipine  10 mg Oral Daily  . aspirin  81 mg Oral Daily  . heparin injection (subcutaneous)  5,000 Units Subcutaneous Q8H  . insulin aspart  0-9 Units Subcutaneous Q4H  . insulin glargine  5 Units Subcutaneous BID  . latanoprost  1 drop Both Eyes QHS  . metoprolol tartrate  12.5 mg Oral BID   Continuous Infusions: . 0.9 % NaCl with KCl 20 mEq / L 50 mL/hr at 01/23/18 1200     LOS: 4 days    Time spent: 40 minutes    Bonnell Public, MD [facility] Pager [phone]  If 7PM-7AM, please contact night-coverage [email or web address] Password [facility] 01/23/2018, 6:35 PM

## 2018-01-23 NOTE — Plan of Care (Signed)
  Nutrition: Adequate nutrition will be maintained 01/23/2018 1033 - Progressing by Darrow BussingArcilla, Jaelani Posa M, RN   Elimination: Will not experience complications related to bowel motility 01/23/2018 1033 - Progressing by Darrow BussingArcilla, Athalee Esterline M, RN   Pain Managment: General experience of comfort will improve 01/23/2018 1033 - Progressing by Darrow BussingArcilla, Rheba Diamond M, RN   Safety: Ability to remain free from injury will improve 01/23/2018 1033 - Progressing by Darrow BussingArcilla, Elliott Quade M, RN

## 2018-01-23 NOTE — Care Management Important Message (Signed)
Important Message  Patient Details  Name: Cheryl Carey MRN: [id number] Date of Birth: [birth year]   Medicare Important Message Given:  Yes    Dorena BodoIris Leatta Alewine 01/23/2018, 12:24 PM

## 2018-01-23 NOTE — Progress Notes (Signed)
Hypoglycemic Event  CBG: 37  Treatment: D50 IV 25 mL  Symptoms: Sweaty  Follow-up CBG: Time:0100 CBG Result:86  Possible Reasons for Event: Inadequate meal intake and Unknown  Comments/MD notified:    Elly ModenaYim, Ishitha Roper

## 2018-01-23 NOTE — Progress Notes (Addendum)
Nutrition Brief Note  48 hr calorie count ordered. Spoke with Dr. Dartha Lodgegbata. PO intake 75% per flowsheet records. Spoke with RN. Pt enjoyed her lunch today. Consumed 80-100% of her meal.  Currently on a CHO/HH diet which provides ~1650 kcals, 86 gm protein. RD to change diet to Carbohydrate Modified which provides ~ 1750 kcals, 71 gm protein. Pt is meeting approximately 85% of estimated kcal needs, 80% of estimated protein needs.  Intervention/Plan:   D/C calorie count  Continue Carbohydrate Modified diet  Maureen ChattersKatie Tye Vigo, RD, LDN Pager #: [phone] After-Hours Pager #: [phone]

## 2018-01-23 NOTE — Progress Notes (Signed)
Spoke with patient again about her blood sugars and insulin. Patient is very concerned about her blood sugars here in the hospital.  Patient takes [place]-Bissauresiba 12 units daily at home. Since Evaristo Buryresiba is not on the formulary here at the hospital, recommend starting Lantus 10 units now and daily and change Novolog correction scale to TID with meals. Patient seems to agree with this regimen if ordered.   Paged Dr. Dartha Lodgegbata to discuss the recommendations.  Smith MinceKendra Jahira Swiss RN BSN CDE Diabetes Coordinator Pager: [phone]  8am-5pm

## 2018-01-24 LAB — GLUCOSE, CAPILLARY
GLUCOSE-CAPILLARY: 318 mg/dL — AB (ref 65–99)
Glucose-Capillary: 113 mg/dL — ABNORMAL HIGH (ref 65–99)
Glucose-Capillary: 191 mg/dL — ABNORMAL HIGH (ref 65–99)
Glucose-Capillary: 282 mg/dL — ABNORMAL HIGH (ref 65–99)

## 2018-01-24 LAB — CULTURE, BLOOD (ROUTINE X 2)
CULTURE: NO GROWTH
CULTURE: NO GROWTH
SPECIAL REQUESTS: ADEQUATE
Special Requests: ADEQUATE

## 2018-01-24 LAB — MAGNESIUM: MAGNESIUM: 1.6 mg/dL — AB (ref 1.7–2.4)

## 2018-01-24 LAB — BASIC METABOLIC PANEL
Anion gap: 11 (ref 5–15)
BUN: 16 mg/dL (ref 6–20)
CALCIUM: 9.4 mg/dL (ref 8.9–10.3)
CHLORIDE: 104 mmol/L (ref 101–111)
CO2: 25 mmol/L (ref 22–32)
CREATININE: 0.99 mg/dL (ref 0.44–1.00)
GFR calc non Af Amer: 55 mL/min — ABNORMAL LOW (ref >60)
Glucose, Bld: 119 mg/dL — ABNORMAL HIGH (ref 65–99)
Potassium: 4.1 mmol/L (ref 3.5–5.1)
SODIUM: 140 mmol/L (ref 135–145)

## 2018-01-24 LAB — CK: CK TOTAL: 400 U/L — AB (ref 38–234)

## 2018-01-24 MED ORDER — INSULIN LISPRO 100 UNIT/ML ~~LOC~~ SOLN: SUBCUTANEOUS | 3 refills | Status: AC

## 2018-01-24 MED ORDER — INSULIN DEGLUDEC 200 UNIT/ML ~~LOC~~ SOPN: 10.0000 [IU] | PEN_INJECTOR | Freq: Every morning | SUBCUTANEOUS | 0 refills | Status: AC

## 2018-01-24 MED ORDER — MAGNESIUM SULFATE 2 GM/50ML IV SOLN
2.0000 g | Freq: Once | INTRAVENOUS | Status: AC
  Administered 2018-01-24: 2 g via INTRAVENOUS
  Filled 2018-01-24: qty 50

## 2018-01-24 NOTE — Plan of Care (Signed)
  Nutrition: Adequate nutrition will be maintained 01/24/2018 1023 - Progressing by Darrow BussingArcilla, Cabela Pacifico M, RN   Elimination: Will not experience complications related to bowel motility 01/24/2018 1023 - Progressing by Darrow BussingArcilla, Derian Pfost M, RN   Pain Managment: General experience of comfort will improve 01/24/2018 1023 - Progressing by Darrow BussingArcilla, Axelle Szwed M, RN   Safety: Ability to remain free from injury will improve 01/24/2018 1023 - Progressing by Darrow BussingArcilla, Jonnie Kubly M, RN

## 2018-01-24 NOTE — Discharge Summary (Signed)
Physician Discharge Summary  Patient ID: Cheryl Carey MRN: [id number] DOB/AGE: Nov 10, 1946 72 y.o.  Admit date: 01/19/2018 Discharge date: 01/24/2018  Admission Diagnoses:  Discharge Diagnoses:  Principal Problem:   Acute encephalopathy, metabolic, likely secondary to hypoglycemia.   Intermittent episodes of hypo-glycemia.   Acute kidney injury, resolved.   Mild rhabdomyolysis, resolved.   Volume depletion, resolved.   SIRS (systemic inflammatory response syndrome) (HCC) Active Problems:   Hypertension   Diabetes mellitus type 2 in nonobese (HCC)   CKD (chronic kidney disease) stage 3, GFR 30-59 ml/min (HCC)   Discharged Condition: stable  Brief Narrative:  Patient is a 72 year old African-American female with past medical history significant for hypertension, DM Type 2 uncontrolled with complications, HLD, Chronic Low Back Pain.  Patient was found altered at home, likely down for a few hours, with likely hypoglycemic episodes.  On admission, patient had mild rhabdomyolysis, volume depleted, and acute kidney injury, with blood sugar around 120s.  Patient was admitted for further assessment and management.  The patient was aggressively hydrated.  Renal function and electrolytes were monitored closely.  Acute kidney injury has resolved.  Rhabdomyolysis has resolved.  During the hospital stay, the patient continued to have episodes of hypoglycemia.  Patient becomes easily resistant to medical management.  Patient Evaristo Bury will be decreased to 10 units once daily.  Patient's short acting insulin will also be decreased.  Patient insists on continuing metformin.  Patient has been advised to check blood sugar 4 times daily at least.  She has been advised to contact the PCP or the endocrinologist for blood sugar greater than 280 or less than 100.  The patient voices understanding of signs and symptoms of hypoglycemia.  Patient also voices understanding of immediate remedy to hypoglycemia.  Patient  has adamantly refused home health nurse to come to her home to assist with medication management.  Patient tells me that she will follow with endocrinologist very early next week.    Principal Problem:   SIRS (systemic inflammatory response syndrome) (HCC) Active Problems:   Hypertension   Diabetes mellitus type 2 in nonobese (HCC)   CKD (chronic kidney disease) stage 3, GFR 30-59 ml/min (HCC)  SIRS -patient was hypothermic on admission, with elevated lactic acid.  As mentioned above, the patient was down at home for a few hours.  With hydration, the lactic acid resolved.  Woman blanket helped with hypothermia.  No infective source.  Pro-calcitonin level was less than 0.1.  Patient has improved significantly, and is back to her baseline.  Diabetes type 2 uncontrolled with complication/Hypoglycemia -patient will be discharged on Tresiba, as well as short acting insulin.  HbA1c was 7.9.  Episodes of hypoglycemia when noted during the hospital stay.  Patient has insisted on continuing metformin on discharge.  Patient has been advised to monitor the blood sugar closely.  Patient knows to contact the primary care provider endocrinologist for low or high blood sugar.  Patient was seen by the diabetic educator during the hospital stay.  Rhabdomyolysis: This was managed with aggressive hydration.  The rhabdomyolysis and improved significantly.  CPK was down to 400 prior to discharge.   Allergic reaction: There are concerns that the patient could have developed allergic reaction to Zosyn during the hospital stay.  Swelling of the legs was reported.  Penicillin allergy should be documented as 1 of the patient's allergies.  Acute Kidney Injury: This is likely secondary to volume depletion.  Rhabdomyolysis could play a role.  The acute kidney injury has  resolved.  Serum creatinine peaked at 1.67.  Serum creatinine prior to discharge was 0.99.  Normocytic anemia -continue to monitor and workup further  on outpatient basis.  Thyroid nodule (1.7 cm) -Will require thyroid US. Can be completed as outpatient -Patient follows up with an Endocrinologist, Dr. Lurene ShadowBallen  Essential HTN -continue to optimize.  Chronic Back pain -stable and well controlled during the hospital stay.  Discharge Exam: Blood pressure (!) 156/67, pulse 77, temperature 98.9 F (37.2 C), temperature source Oral, resp. rate 17, height 5\' 1"  (1.549 m), weight 86.6 kg (190 lb 14.7 oz), SpO2 93 %.  Disposition: Home.  Discharge Instructions    Call MD for:   Complete by:  As directed    Please call MD if symptoms worsen or if you develop symptoms and signs of hypoglycemia   Diet - low sodium heart healthy   Complete by:  As directed    Diet Carb Modified   Complete by:  As directed    Discharge instructions   Complete by:  As directed    Please check your blood sugar at least 4 times daily.  Call your PCP or the endocrinologist for blood sugar greater than 270 or less than 100.   Increase activity slowly   Complete by:  As directed      Allergies as of 01/24/2018      Reactions   Zosyn [piperacillin Sod-tazobactam So] Swelling   Lip swelling   Codeine Nausea Only      Medication List    STOP taking these medications   furosemide 20 MG tablet Commonly known as:  LASIX     TAKE these medications   amLODipine 10 MG tablet Commonly known as:  NORVASC Take 10 mg by mouth daily.   aspirin 81 MG tablet Take 81 mg by mouth daily.   cyclobenzaprine 5 MG tablet Commonly known as:  FLEXERIL Take 5 mg by mouth 3 (three) times daily as needed for muscle spasms.   ferrous sulfate 325 (65 FE) MG tablet Take 325 mg by mouth 2 (two) times daily with a meal.   Insulin Degludec 200 UNIT/ML Sopn Commonly known as:  TRESIBA FLEXTOUCH Inject 10 Units into the skin every morning. What changed:    how much to take  when to take this   insulin lispro 100 UNIT/ML injection Commonly known as:   HUMALOG Subcutaneous Humalog 4 units in the morning, 2 units in the afternoon and evening What changed:    how much to take  how to take this  when to take this  additional instructions   latanoprost 0.005 % ophthalmic solution Commonly known as:  XALATAN Place 1 drop into both eyes at bedtime.   metFORMIN 500 MG tablet Commonly known as:  GLUCOPHAGE Take 1,000 mg by mouth 2 (two) times daily with a meal.   MULTI COMPLETE PO Take 1 tablet by mouth daily.   oxyCODONE-acetaminophen 5-325 MG tablet Commonly known as:  PERCOCET/ROXICET Take 1 tablet by mouth every 4 (four) hours as needed for moderate pain.   quinapril 40 MG tablet Commonly known as:  ACCUPRIL Take 40 mg by mouth daily.        SignedBarnetta Chapel: Beverely Suen I Concettina Leth 01/24/2018, 10:41 AM

## 2018-01-24 NOTE — Progress Notes (Signed)
Provided discharge education/instructions, all questions and concerns addressed, Pt not in distress, discharged home with belongings. 

## 2019-07-21 IMAGING — CT CT MAXILLOFACIAL W/O CM
3 series · 15 of 47 positions shown, 18 images · non-contrast
Comparison: Head and cervical spine CT scan this same day.

CLINICAL DATA: Patient found down today with a nose bleed. Initial
encounter.

EXAM:
CT MAXILLOFACIAL WITHOUT CONTRAST
TECHNIQUE: Multidetector CT imaging of the maxillofacial structures was
performed. Multiplanar CT image reconstructions were also generated.

[Series 3: facial/ orbits 2.0 h30s · axial · 0.35mm/px · z∈[-170,-32]mm · 9 of 81 slices shown, 12 images]
[im 6/81  brain]
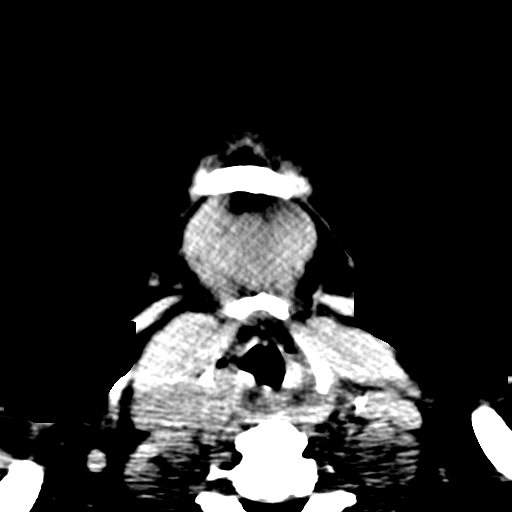
[im 6/81  bone]
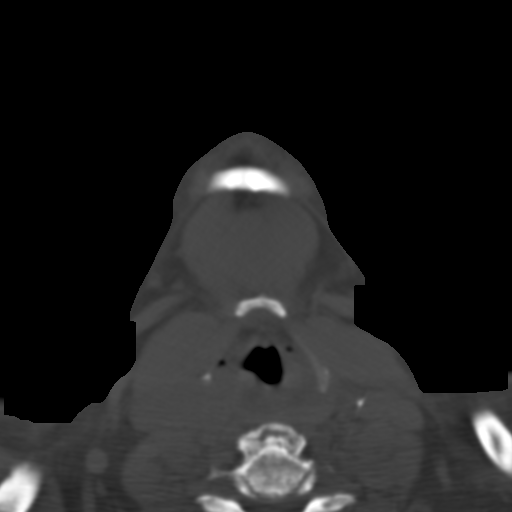
[im 14/81  bone]
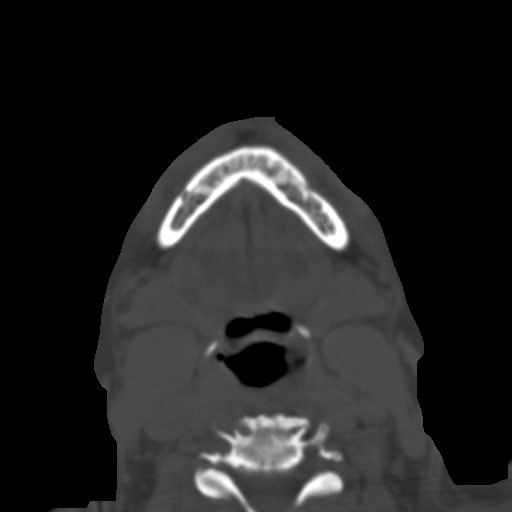
[im 23/81  bone]
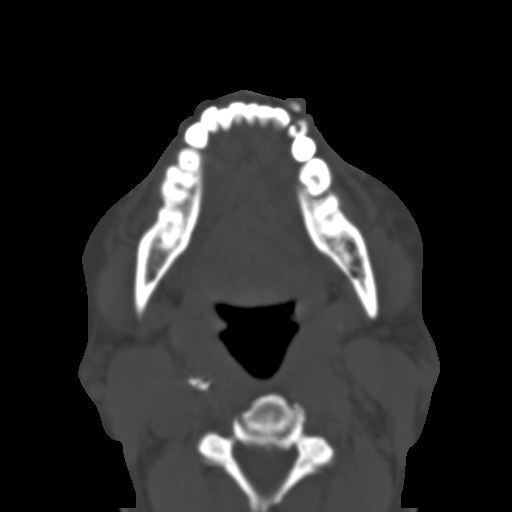
[im 31/81  bone]
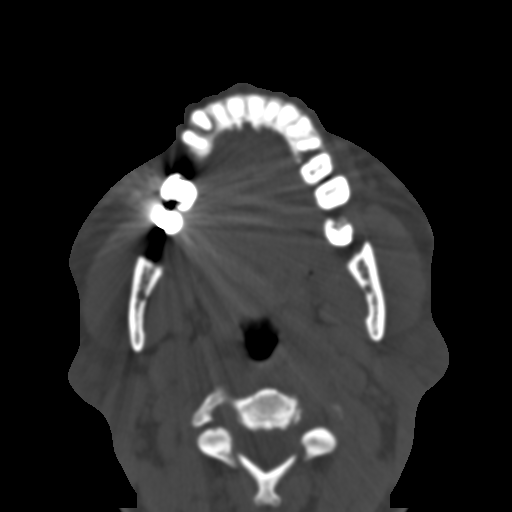
[im 42/81  brain]
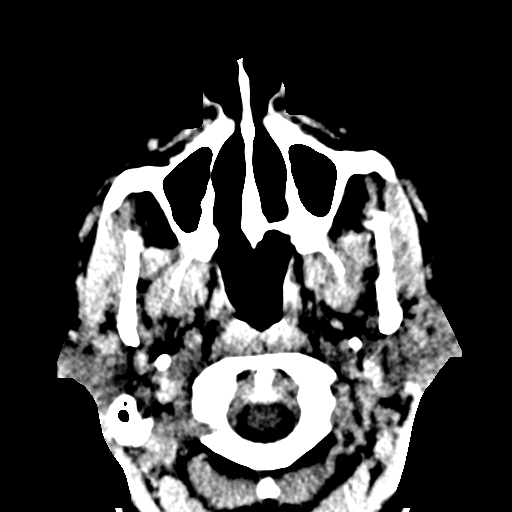
[im 42/81  bone]
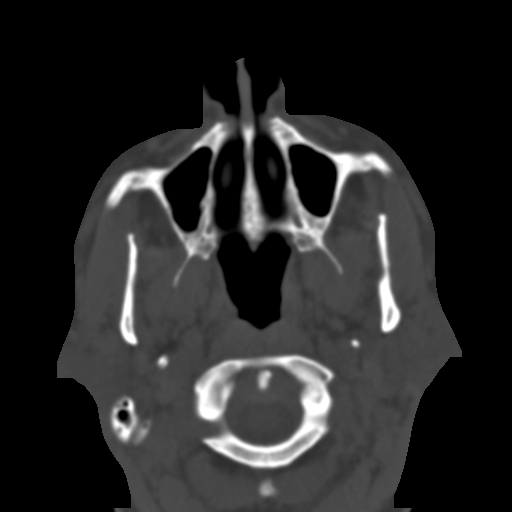
[im 50/81  bone]
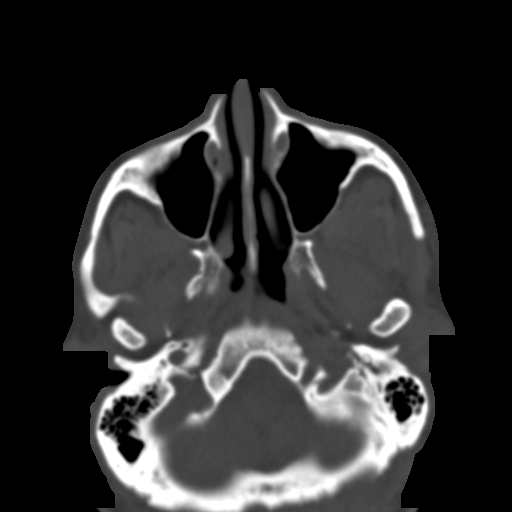
[im 58/81  bone]
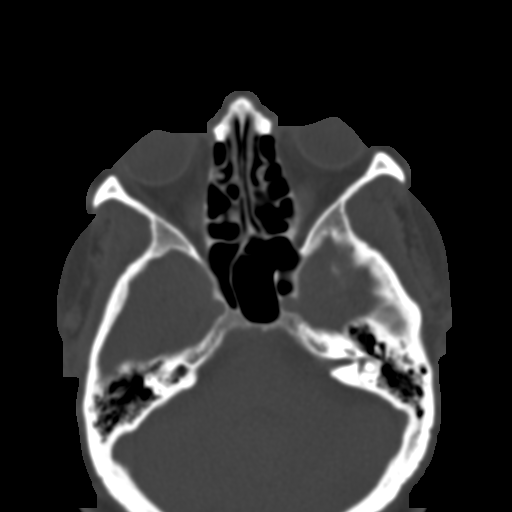
[im 67/81  bone]
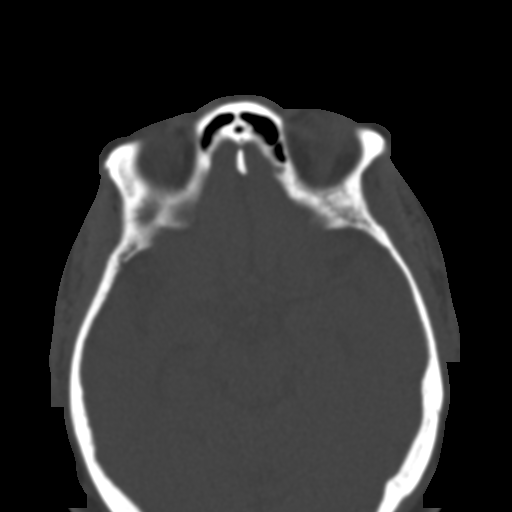
[im 75/81  brain]
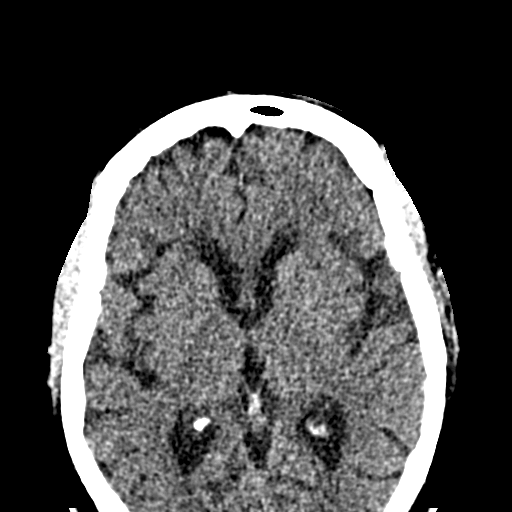
[im 75/81  bone]
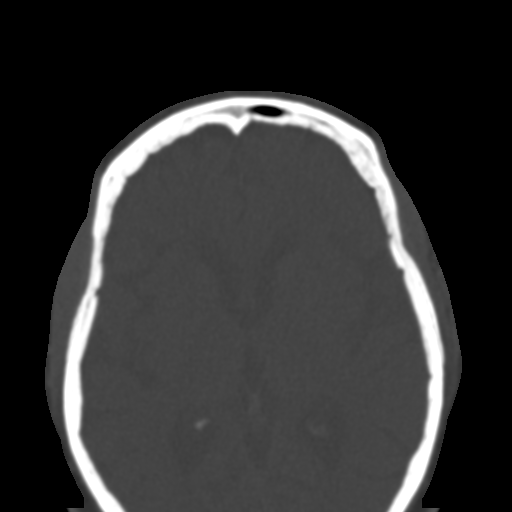

[Series 7: coronal soft tissue · coronal · 0.39mm/px · 3 of 120 slices shown]
[im 40/120  bone]
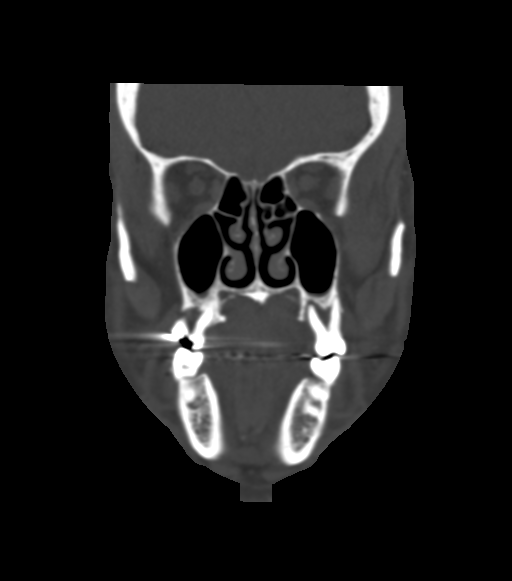
[im 53/120  bone]
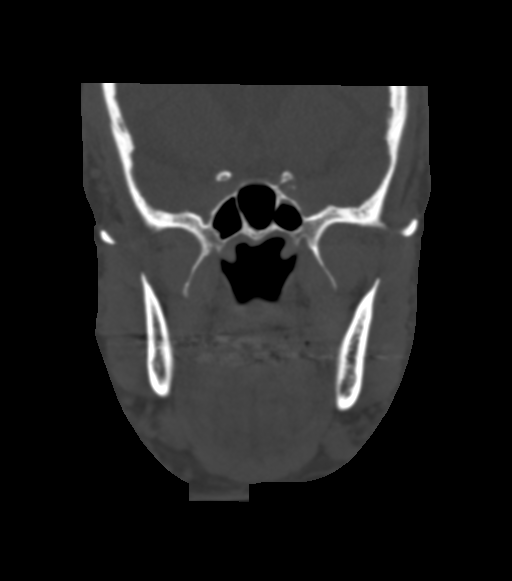
[im 67/120  bone]
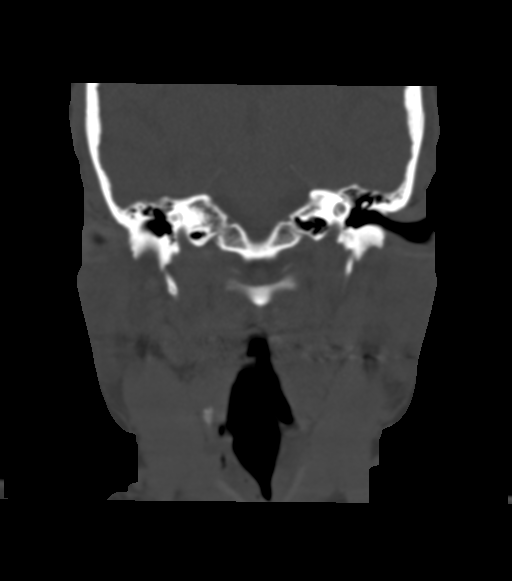

[Series 8: sagittal soft tissue · sagittal · 0.38mm/px · 3 of 76 slices shown]
[im 26/76  bone]
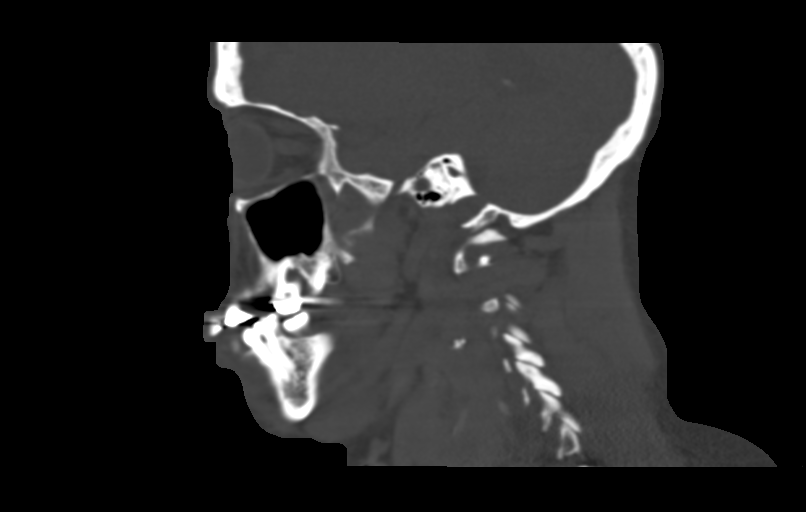
[im 38/76  bone]
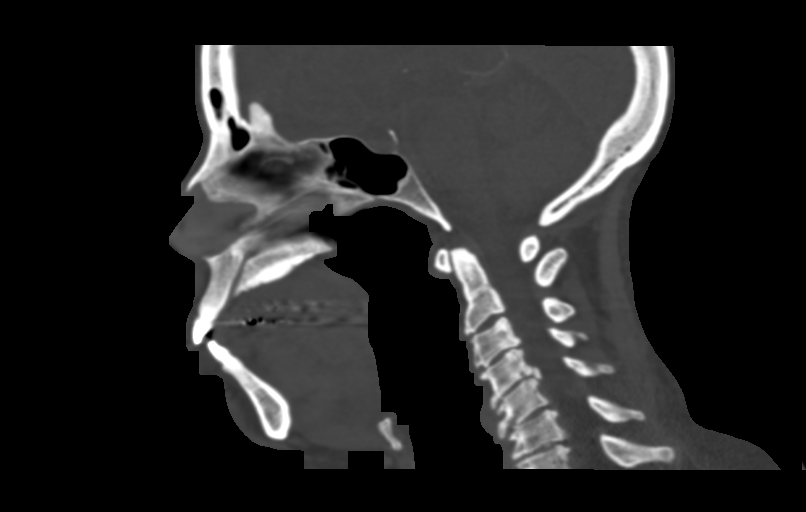
[im 51/76  bone]
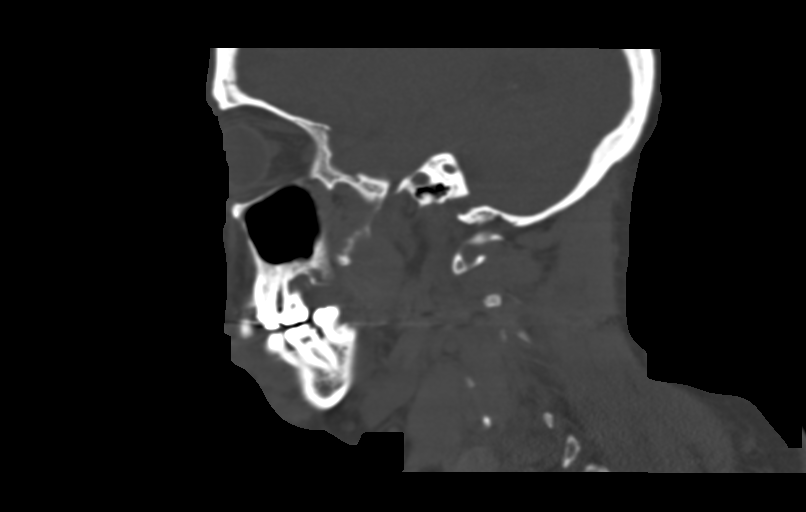

[15 of 47 positions shown; findings below may reference images not displayed]

FINDINGS: Osseous: No fracture or mandibular dislocation. No destructive
process.

Orbits: Negative. No traumatic or inflammatory finding. Status post
bilateral lens extraction.

Sinuses: Clear.

Soft tissues: Negative.

Limited intracranial: No acute finding.
IMPRESSION: No acute abnormality.

## 2019-07-21 IMAGING — CT CT CERVICAL SPINE W/O CM
5 of 8 series · 11 of 33 positions shown, 12 images · non-contrast
Comparison: None.

CLINICAL DATA: 72 y/o  F; found on floor covered in vomitus.

EXAM:
CT HEAD WITHOUT CONTRAST
CT CERVICAL SPINE WITHOUT CONTRAST
TECHNIQUE: Multidetector CT imaging of the head and cervical spine was
performed following the standard protocol without intravenous
contrast. Multiplanar CT image reconstructions of the cervical spine
were also generated.

[Series 4: head bone · axial · 0.41mm/px · z∈[-128,-78]mm · 2 of 77 slices shown]
[im 26/77  bone]
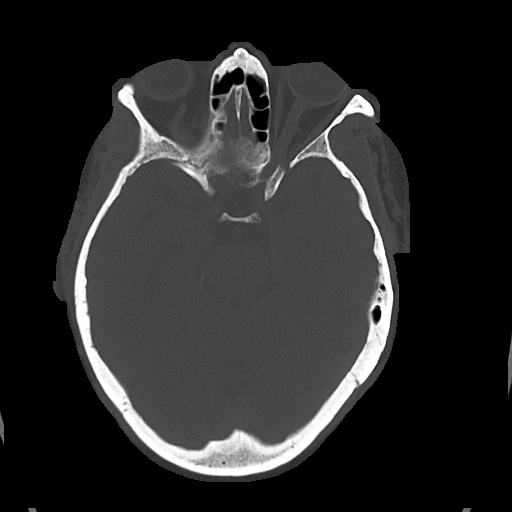
[im 51/77  bone]
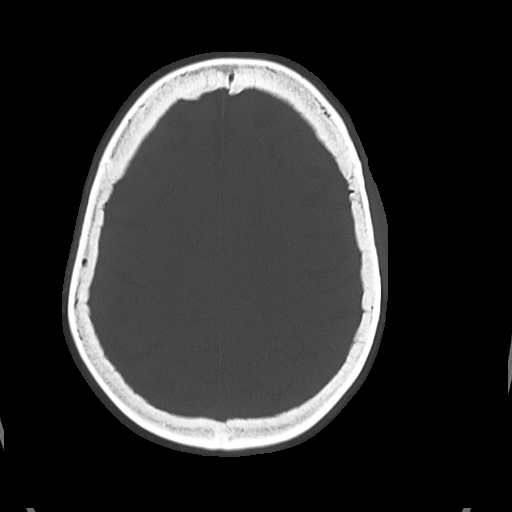

[Series 8: c spine soft · axial · 0.29mm/px · z∈[-257,-205]mm · 2 of 80 slices shown]
[im 27/80  soft-tissue]
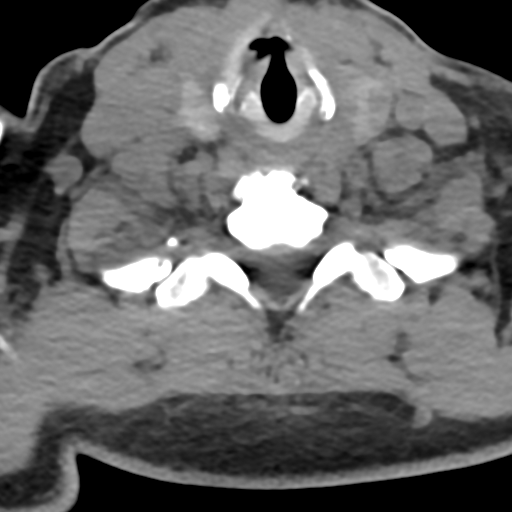
[im 53/80  soft-tissue]
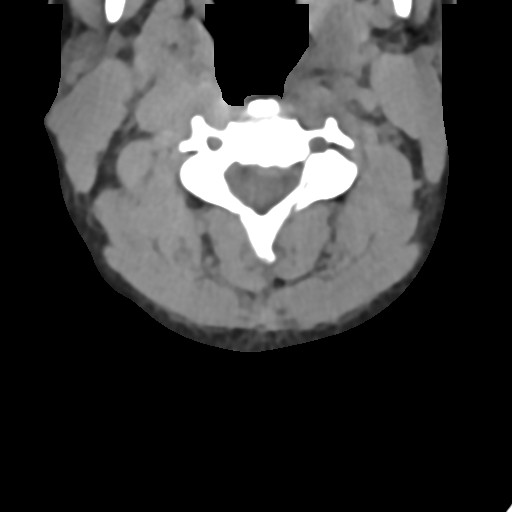

[Series 9: sag bone · sagittal · 0.31mm/px · 4 of 48 slices shown]
[im 10/48  bone]
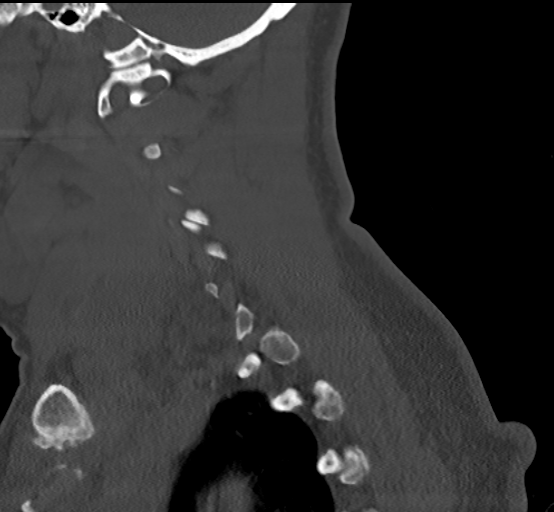
[im 19/48  bone]
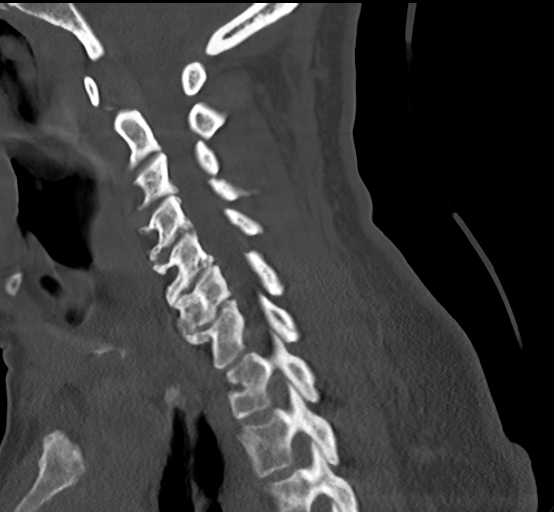
[im 29/48  bone]
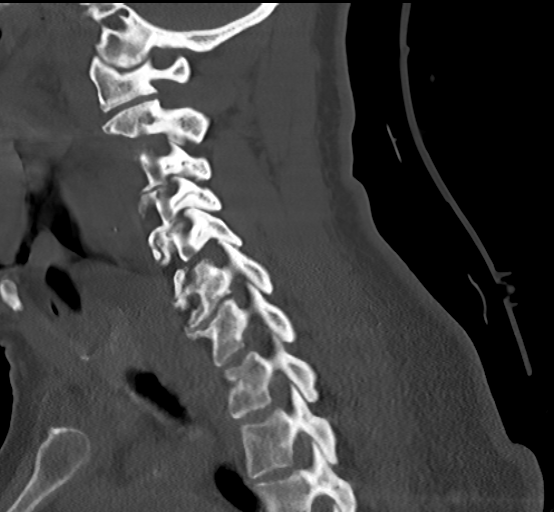
[im 38/48  bone]
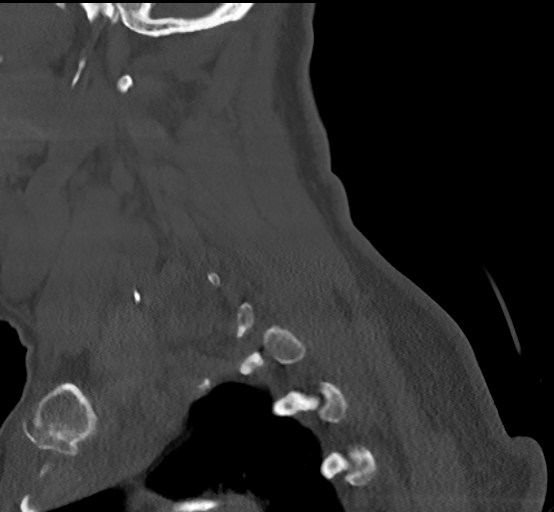

[Series 11: cor bone · coronal · 0.23mm/px · 1 of 66 slices shown]
[im 33/66  bone]
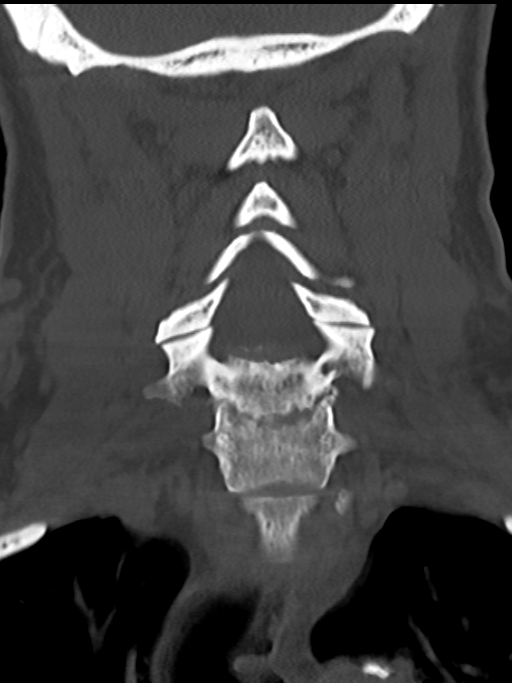

[Series 12: orthogonal axials · axial · 0.21mm/px · z∈[-273,-222]mm · 2 of 88 slices shown, 3 images]
[im 30/88  soft-tissue]
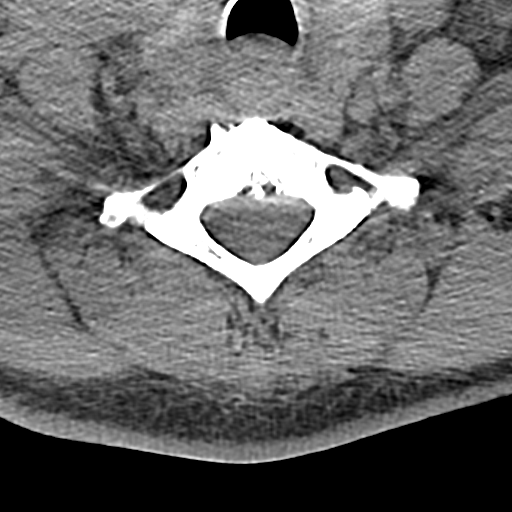
[im 30/88  bone]
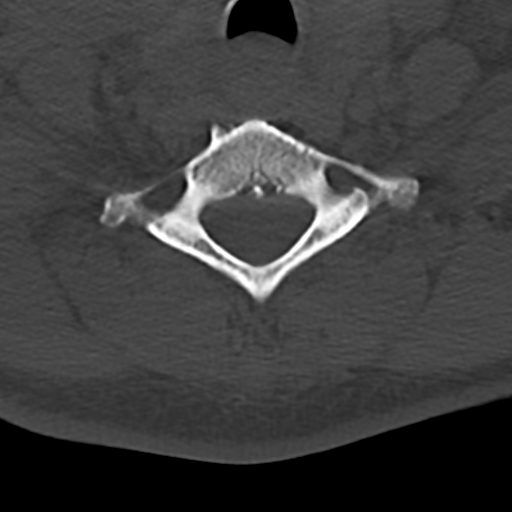
[im 59/88  bone]
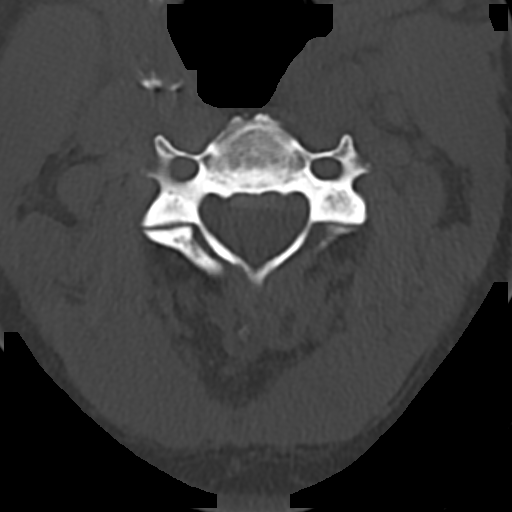

[11 of 33 positions shown; findings below may reference images not displayed]

FINDINGS: CT HEAD FINDINGS

Brain: No evidence of acute infarction, hemorrhage, hydrocephalus,
extra-axial collection or mass lesion/mass effect. Mild chronic
microvascular ischemic changes and parenchymal volume loss of the
brain.

Vascular: Calcific atherosclerosis of carotid siphons. No hyperdense
vessel.

Skull: Normal. Negative for fracture or focal lesion.

Sinuses/Orbits: No acute finding.

Other: None.

CT CERVICAL SPINE FINDINGS

Alignment: Straightening of cervical lordosis without listhesis.

Skull base and vertebrae: No acute fracture. No primary bone lesion
or focal pathologic process.

Soft tissues and spinal canal: No prevertebral fluid or swelling. No
visible canal hematoma.

Disc levels: Moderate cervical spondylosis with predominant
discogenic degenerative changes. Multilevel disc space narrowing and
marginal osteophytes. Uncovertebral hypertrophy encroaches on the
left C3-4, bilateral C4-5, right C5-6, and bilateral C6-7 neural
foramen. No high-grade bony canal stenosis.

Upper chest: Negative.

Other: Multiple thyroid nodules measuring up to 17 mm in the left
lobe. Carotid and aortic calcific atherosclerosis.
IMPRESSION: 1. No acute intracranial abnormality or calvarial fracture.
2. No acute fracture or dislocation of cervical spine.
3. Mild chronic microvascular ischemic changes and parenchymal
volume loss of the brain.
4. Moderate cervical spondylosis with predominant discogenic
degenerative changes.
5. Thyroid nodules measuring up to 17 mm. Thyroid ultrasound is
recommended on a nonemergent basis for further evaluation.

By: Bazallahi Maxi M.D.

## 2019-07-24 IMAGING — DX DG CHEST 1V PORT
1 series · 1 of 1 positions shown · non-contrast
Comparison: 01/19/2018.

CLINICAL DATA: Altered mental status.  Cough.

EXAM:
PORTABLE CHEST 1 VIEW

[chest ap]
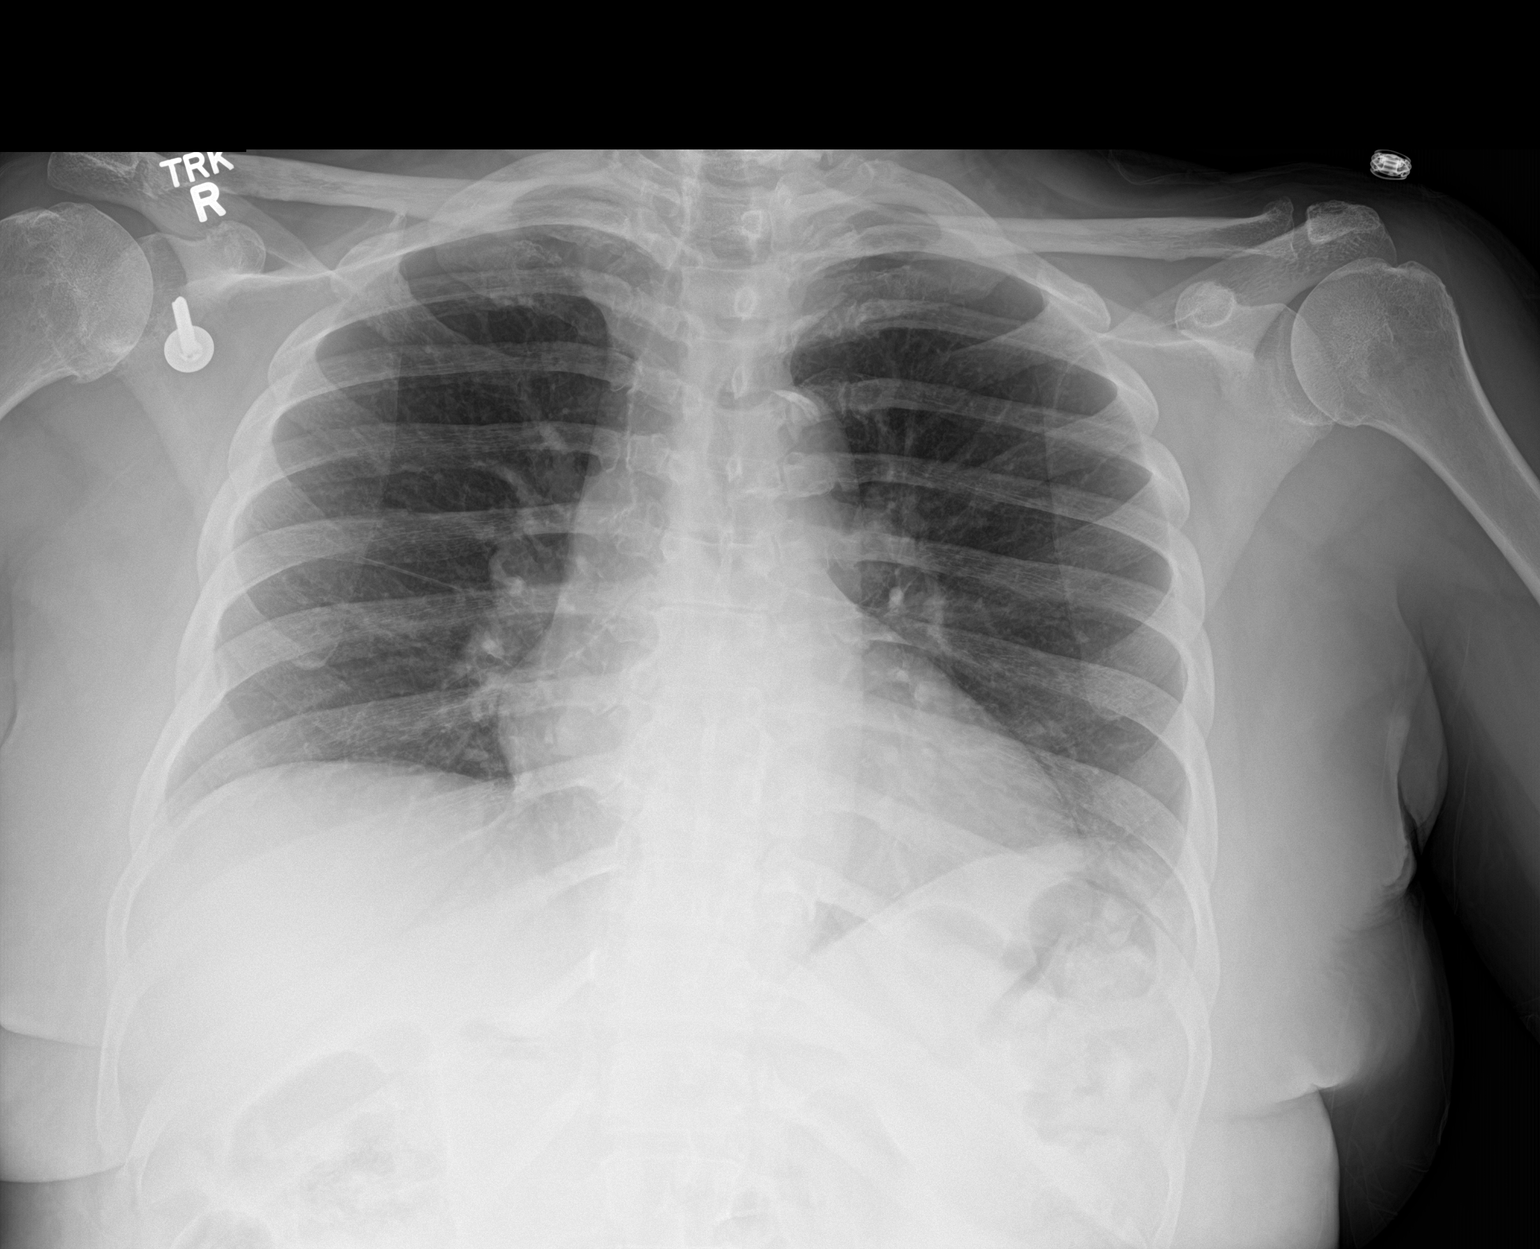

[1 of 1 positions shown; findings below may reference images not displayed]

FINDINGS: Mediastinum and hilar structures normal. Heart size stable. Normal
pulmonary vascularity. Low lung volumes. No pleural effusion or
pneumothorax. Surgical screw noted over the right shoulder.
IMPRESSION: Low lung volumes. No acute cardiopulmonary disease. Chest is stable
from prior exam.
